# Patient Record
Sex: Female | Born: 1950 | Race: Black or African American | Hispanic: No | Smoking: Former smoker
Health system: Southern US, Community
[De-identification: ages and names within clinical notes are randomized; demographics above are authoritative.]

## PROBLEM LIST (undated history)

## (undated) DIAGNOSIS — G43909 Migraine, unspecified, not intractable, without status migrainosus: Secondary | ICD-10-CM

## (undated) DIAGNOSIS — E039 Hypothyroidism, unspecified: Secondary | ICD-10-CM

## (undated) DIAGNOSIS — M1990 Unspecified osteoarthritis, unspecified site: Secondary | ICD-10-CM

## (undated) DIAGNOSIS — I10 Essential (primary) hypertension: Secondary | ICD-10-CM

## (undated) DIAGNOSIS — E079 Disorder of thyroid, unspecified: Secondary | ICD-10-CM

## (undated) HISTORY — PX: HIP SURGERY: SHX245

## (undated) HISTORY — PX: HERNIA REPAIR: SHX51

## (undated) HISTORY — PX: JOINT REPLACEMENT: SHX530

## (undated) HISTORY — PX: ABDOMINAL HYSTERECTOMY: SHX81

---

## 2000-09-17 ENCOUNTER — Emergency Department (HOSPITAL_COMMUNITY): Admission: EM | Admit: 2000-09-17 | Discharge: 2000-09-18 | Payer: Self-pay | Admitting: *Deleted

## 2001-07-03 ENCOUNTER — Encounter: Payer: Self-pay | Admitting: Emergency Medicine

## 2001-07-03 ENCOUNTER — Emergency Department (HOSPITAL_COMMUNITY): Admission: EM | Admit: 2001-07-03 | Discharge: 2001-07-03 | Payer: Self-pay | Admitting: Emergency Medicine

## 2001-07-05 ENCOUNTER — Encounter: Payer: Self-pay | Admitting: Internal Medicine

## 2001-07-05 ENCOUNTER — Inpatient Hospital Stay (HOSPITAL_COMMUNITY): Admission: EM | Admit: 2001-07-05 | Discharge: 2001-07-08 | Payer: Self-pay | Admitting: *Deleted

## 2001-07-06 ENCOUNTER — Encounter: Payer: Self-pay | Admitting: *Deleted

## 2001-09-12 ENCOUNTER — Emergency Department (HOSPITAL_COMMUNITY): Admission: EM | Admit: 2001-09-12 | Discharge: 2001-09-12 | Payer: Self-pay | Admitting: *Deleted

## 2001-09-12 ENCOUNTER — Encounter: Payer: Self-pay | Admitting: *Deleted

## 2001-11-07 ENCOUNTER — Encounter (HOSPITAL_COMMUNITY): Admission: RE | Admit: 2001-11-07 | Discharge: 2001-12-07 | Payer: Self-pay | Admitting: Sports Medicine

## 2001-12-08 ENCOUNTER — Encounter (HOSPITAL_COMMUNITY): Admission: RE | Admit: 2001-12-08 | Discharge: 2002-01-07 | Payer: Self-pay | Admitting: Sports Medicine

## 2002-06-15 ENCOUNTER — Emergency Department (HOSPITAL_COMMUNITY): Admission: EM | Admit: 2002-06-15 | Discharge: 2002-06-16 | Payer: Self-pay | Admitting: Emergency Medicine

## 2002-06-15 ENCOUNTER — Encounter: Payer: Self-pay | Admitting: Emergency Medicine

## 2002-11-06 ENCOUNTER — Ambulatory Visit (HOSPITAL_COMMUNITY): Admission: RE | Admit: 2002-11-06 | Discharge: 2002-11-06 | Payer: Self-pay | Admitting: Family Medicine

## 2003-11-07 ENCOUNTER — Emergency Department (HOSPITAL_COMMUNITY): Admission: EM | Admit: 2003-11-07 | Discharge: 2003-11-07 | Payer: Self-pay | Admitting: Emergency Medicine

## 2004-05-10 ENCOUNTER — Ambulatory Visit (HOSPITAL_COMMUNITY): Admission: RE | Admit: 2004-05-10 | Discharge: 2004-05-10 | Payer: Self-pay | Admitting: Family Medicine

## 2004-05-13 ENCOUNTER — Emergency Department (HOSPITAL_COMMUNITY): Admission: EM | Admit: 2004-05-13 | Discharge: 2004-05-13 | Payer: Self-pay | Admitting: Emergency Medicine

## 2004-05-25 ENCOUNTER — Emergency Department (HOSPITAL_COMMUNITY): Admission: EM | Admit: 2004-05-25 | Discharge: 2004-05-25 | Payer: Self-pay | Admitting: Emergency Medicine

## 2004-06-13 ENCOUNTER — Ambulatory Visit (HOSPITAL_COMMUNITY): Admission: RE | Admit: 2004-06-13 | Discharge: 2004-06-13 | Payer: Self-pay | Admitting: Family Medicine

## 2004-11-25 ENCOUNTER — Emergency Department (HOSPITAL_COMMUNITY): Admission: EM | Admit: 2004-11-25 | Discharge: 2004-11-25 | Payer: Self-pay | Admitting: Emergency Medicine

## 2005-01-29 ENCOUNTER — Emergency Department (HOSPITAL_COMMUNITY): Admission: EM | Admit: 2005-01-29 | Discharge: 2005-01-29 | Payer: Self-pay | Admitting: Emergency Medicine

## 2005-02-26 ENCOUNTER — Emergency Department (HOSPITAL_COMMUNITY): Admission: EM | Admit: 2005-02-26 | Discharge: 2005-02-26 | Payer: Self-pay | Admitting: Emergency Medicine

## 2005-10-18 ENCOUNTER — Emergency Department (HOSPITAL_COMMUNITY): Admission: EM | Admit: 2005-10-18 | Discharge: 2005-10-18 | Payer: Self-pay | Admitting: Emergency Medicine

## 2005-12-10 ENCOUNTER — Emergency Department (HOSPITAL_COMMUNITY): Admission: EM | Admit: 2005-12-10 | Discharge: 2005-12-10 | Payer: Self-pay | Admitting: Emergency Medicine

## 2006-02-27 ENCOUNTER — Emergency Department (HOSPITAL_COMMUNITY): Admission: EM | Admit: 2006-02-27 | Discharge: 2006-02-27 | Payer: Self-pay | Admitting: Emergency Medicine

## 2006-04-13 ENCOUNTER — Emergency Department (HOSPITAL_COMMUNITY): Admission: EM | Admit: 2006-04-13 | Discharge: 2006-04-14 | Payer: Self-pay | Admitting: Emergency Medicine

## 2006-10-20 ENCOUNTER — Emergency Department (HOSPITAL_COMMUNITY): Admission: EM | Admit: 2006-10-20 | Discharge: 2006-10-20 | Payer: Self-pay | Admitting: Emergency Medicine

## 2006-10-21 ENCOUNTER — Observation Stay (HOSPITAL_COMMUNITY): Admission: EM | Admit: 2006-10-21 | Discharge: 2006-10-24 | Payer: Self-pay | Admitting: Emergency Medicine

## 2008-01-20 ENCOUNTER — Emergency Department (HOSPITAL_COMMUNITY): Admission: EM | Admit: 2008-01-20 | Discharge: 2008-01-20 | Payer: Self-pay | Admitting: Emergency Medicine

## 2009-02-21 ENCOUNTER — Emergency Department (HOSPITAL_COMMUNITY): Admission: EM | Admit: 2009-02-21 | Discharge: 2009-02-21 | Payer: Self-pay | Admitting: Emergency Medicine

## 2009-02-27 ENCOUNTER — Emergency Department (HOSPITAL_COMMUNITY): Admission: EM | Admit: 2009-02-27 | Discharge: 2009-02-27 | Payer: Self-pay | Admitting: Emergency Medicine

## 2009-05-24 ENCOUNTER — Emergency Department (HOSPITAL_COMMUNITY): Admission: EM | Admit: 2009-05-24 | Discharge: 2009-05-24 | Payer: Self-pay | Admitting: Emergency Medicine

## 2009-06-11 ENCOUNTER — Emergency Department (HOSPITAL_COMMUNITY): Admission: EM | Admit: 2009-06-11 | Discharge: 2009-06-11 | Payer: Self-pay | Admitting: Emergency Medicine

## 2009-06-13 ENCOUNTER — Ambulatory Visit (HOSPITAL_COMMUNITY): Admission: RE | Admit: 2009-06-13 | Discharge: 2009-06-13 | Payer: Self-pay | Admitting: Emergency Medicine

## 2010-05-31 LAB — CBC
HCT: 41.7 % (ref 36.0–46.0)
Hemoglobin: 14.7 g/dL (ref 12.0–15.0)
MCHC: 35.2 g/dL (ref 30.0–36.0)
MCV: 96.7 fL (ref 78.0–100.0)
Platelets: 211 10*3/uL (ref 150–400)
RBC: 4.31 MIL/uL (ref 3.87–5.11)
RDW: 14.8 % (ref 11.5–15.5)
WBC: 11.1 10*3/uL — ABNORMAL HIGH (ref 4.0–10.5)

## 2010-05-31 LAB — BASIC METABOLIC PANEL
BUN: 11 mg/dL (ref 6–23)
CO2: 29 mEq/L (ref 19–32)
Calcium: 9.6 mg/dL (ref 8.4–10.5)
Chloride: 105 mEq/L (ref 96–112)
Creatinine, Ser: 0.84 mg/dL (ref 0.4–1.2)
GFR calc Af Amer: >60 mL/min (ref >60)
GFR calc non Af Amer: >60 mL/min (ref >60)
Glucose, Bld: 96 mg/dL (ref 70–99)
Potassium: 4 mEq/L (ref 3.5–5.1)
Sodium: 141 mEq/L (ref 135–145)

## 2010-05-31 LAB — DIFFERENTIAL
Basophils Absolute: 0.1 10*3/uL (ref 0.0–0.1)
Basophils Relative: 1 % (ref 0–1)
Eosinophils Absolute: 0.1 10*3/uL (ref 0.0–0.7)
Eosinophils Relative: 1 % (ref 0–5)
Lymphocytes Relative: 35 % (ref 12–46)
Lymphs Abs: 3.9 10*3/uL (ref 0.7–4.0)
Monocytes Absolute: 1.1 10*3/uL — ABNORMAL HIGH (ref 0.1–1.0)
Monocytes Relative: 10 % (ref 3–12)
Neutro Abs: 5.9 10*3/uL (ref 1.7–7.7)
Neutrophils Relative %: 53 % (ref 43–77)

## 2010-06-02 LAB — SEDIMENTATION RATE: Sed Rate: 16 mm/hr (ref 0–22)

## 2010-07-21 ENCOUNTER — Emergency Department (HOSPITAL_COMMUNITY)
Admission: EM | Admit: 2010-07-21 | Discharge: 2010-07-21 | Disposition: A | Discharge status: Home / Self Care | Payer: Self-pay | Attending: Emergency Medicine | Admitting: Emergency Medicine

## 2010-07-21 DIAGNOSIS — K5900 Constipation, unspecified: Secondary | ICD-10-CM | POA: Insufficient documentation

## 2010-07-25 NOTE — Consult Note (Signed)
Rhonda [name], Rhonda [name]               ACCOUNT NO.:  [id number]   MEDICAL RECORD NO.:  [id number]          PATIENT TYPE:  OBV   LOCATION:  A309                          FACILITY:  [facility]   PHYSICIAN:  Kofi A. Gerilyn Pilgrim, M.D. DATE OF BIRTH:  02/12/1950   DATE OF CONSULTATION:  DATE OF DISCHARGE:                                 CONSULTATION   This is a 60 year old black female who was in her state of usual normal  health when she developed headache on awakening one morning.  The  headache was in the frontal region in the left periorbital area.  She  graded it as 4/10 pain initially.  As the day progressed, however, the  headache became worse and over several hours progressed to a 10/10  headache.  She did have some vertigo described as a spinning sensation  with this.  She has had vertigo in the past.  The vertigo has been a  longstanding problem again associated with a spinning sensation.  This  however was different as she had this progressive and severe headache.  She also had intense nausea and vomiting, which resulted in making the  patient come to the emergency room.  She does not have a prior history  of headaches.   PAST MEDICAL HISTORY:  Significant for episodic vertigo and bipolar  disorder.   MEDICATIONS:  None.   FAMILY HISTORY:  Unremarkable.   SURGICAL HISTORY:  Hysterectomy.   SOCIAL HISTORY:  She is a non-smoker.  No illicit drug use.  Does  consume alcohol socially.  She is married and lives with her spouse.   ALLERGIES:  NONE KNOWN.   REVIEW OF SYSTEMS:  Essentially unremarkable other than stated in the  history of present illness and unchanged from Dr. Claudean Severance on October 21, 2006.   PHYSICAL EXAMINATION:  GENERAL:  She is a pleasant mildly overweight  lady in no acute distress.  VITAL SIGNS:  Temperature 98.8, pulse 73, respirations 20, blood  pressure 136/78.  HEENT:  The head is supple.  Head is normocephalic, atraumatic.  ABDOMEN:  Soft.  EXTREMITIES:  No  significant edema.  MENTATION:  The patient is awake and alert.  She converses well.  Speech, language and cognition are intact.  CRANIAL NERVES:  Cranial nerve evaluation shows the pupils are equally  round and reactive to light and accommodation.  Extraocular movements  are intact.  Visual field full.  Facial motor strength is symmetric.  Tongue is midline.  Uvula is midline.  Tympanic membranes are fine,  although there is slight erythema on the left.  She did complain of  having a left ear ache a few days ago.  Motor examination shows normal  tongue bulk and strength.  There is no pronator drift.  Coordination is  intact.  There is no pass point or dysmetria.  Reflexes are symmetric  with downgoing plantar reflexes.  Sensation is normal to temperature and  light touch.   RADIOLOGIC DATA:  CT scan is negative.  MRI was also done and shows no  acute findings, essentially negative scan.   LABORATORY DATA:  Spinal fluid analysis was obtained, and cultures are  pending.  So far, they have been negative.  Protein slightly high at 52,  glucose 59, WBC R, RBCs 0.   ASSESSMENT:  New onset severe headache.  The character is consistent  with a severe migraine headache with an unusual presentation given her  age.  So far, most secondary causes of headache have been ruled out.  I  am still concerned about possible vascular malformation.   RECOMMENDATIONS:  I am going to suggest that MRA be obtained.  We should  continue with symptomatic treatment.  We may use opioids until her  workup is complete and symptomatic etiology ruled out.  Thank you for  this consultation.      Kofi A. Gerilyn Pilgrim, M.D.  Electronically Signed     KAD/MEDQ  D:  10/23/2006  T:  10/24/2006  Job:  [id number]

## 2010-07-25 NOTE — H&P (Signed)
Rhonda Rhonda [name], Rhonda [name] NO.:  [id number]   MEDICAL RECORD NO.:  [id number]          PATIENT TYPE:  OBV   LOCATION:  A309                          FACILITY:  [facility]   PHYSICIAN:  Dorris Singh, DO    DATE OF BIRTH:  02/12/1950   DATE OF ADMISSION:  10/21/2006  DATE OF DISCHARGE:  LH                              HISTORY & PHYSICAL   CHIEF COMPLAINT:  Headache.   HISTORY OF PRESENT ILLNESS:  The patient is a 60 year old African  American female who presented to the [facility] emergency room today  with a 1-day complaint of severe headache located in the frontal region.  The patient stated that headache got progressively worse with movement  as well as was followed by nausea and vomiting that would not stop.  She  was then seen in the emergency room, in which a lumbar puncture was done  and also a CT was done, and those were both negative.  The patient  stated that prior to this she has had bouts of vertigo and decided to  take some Antivert, which she got little relief from but still was  experiencing dizziness upon arrival to the emergency room.   PAST MEDICAL HISTORY:  Positive for bipolar disorder.  Currently she is  not being treated on any medication.   FAMILY HISTORY:  She says noncontributory.   SURGICAL HISTORY:  Positive for a hysterectomy.   SOCIAL HISTORY:  She is a nonsmoker, social drinker.  She currently  lives with her spouse and does not use any drugs.   She states that she has no known drug allergies and currently she was  taking Depakote a month ago, 2000 mg, but stopped that on her own and  she just took one tablet of Antivert that was old, dose unknown, today,  as far as medications are concerned.   REVIEW OF SYSTEMS:  GENERAL:  The patient denies any weight changes,  fatigue, but positive for a 1-day history of weakness.  There are no  skin changes, hair or nail changes or rashes.  HEAD:  Positive headache  located in the frontal region  with positive nausea, positive vomiting,  and positive visual changes.  EYES:  The patient is positive for  blurriness.  MOUTH, THROAT AND NECK:  Denies any bleeding gums,  hoarseness or sore throat.  CARDIAC:  Denies any history of  hypertension, murmurs and angina.  RESPIRATORY:  Denies any shortness of  breath, wheezing, coughing.  GI:  Denies any appetite changes, diarrhea,  constipation or bleeding.  URINARY:  Denies any frequency, hesitancy, or  urgency.  NEUROLOGIC:  Denies any loss of sensation, numbness or  tingling or paralysis, blackouts or seizures.  PSYCHIATRIC:  Positive  for bipolar disorder.  MUSCULOSKELETAL:  Negative muscular weakness,  joint stiffness, no changes in range of motion or swelling.   PHYSICAL EXAMINATION:  GENERAL:  This is a 60 year old [place] American  female who is well-developed, well-nourished, in no acute distress.  She  is pleasant and her affect is appropriate.  VITALS:  Temperature is 98, pulse is 69,  respirations 20, blood pressure  is 136/72.  SKIN:  There are no skin scars, rashes, bruises or tattoos.  Hair is  normal consistency.  Head is normocephalic, atraumatic and the shape is symmetrical.  Pupils:  The patient refused pupil examination due to light.  Ears are normal  shape and size.  No discharge noted.  Nose:  No turbinate discharge or  inflammation.  Throat:  No erythema or exudate noted.  NECK:  No masses.  Full range of motion.  HEART:  Regular rate and rhythm.  No murmurs, rubs or gallops.  LUNGS:  Chest symmetrical with respirations, no crackles, wheezes or  rhonchi.  ABDOMEN:  Round, soft, nontender, nondistended.  No rebound tenderness,  masses or guarding noted.  MUSCULOSKELETAL:  Full range of motion.  No redness, swelling or  tenderness in any of the extremities.  NEUROLOGIC:  Cranial nerves II-XII grossly intact.   LABS:  The patient had a lumbar puncture that did not show any  abnormalities.  Neutrophils, leukocytes,  monocytes and eosinophils were  few to count and she had a sedimentation rate done, which was 7.  Her  CMP:  Sodium was 140, potassium 3.7, chloride 107, carbon dioxide 25,  glucose 108, BUN 9 and creatinine 0.75.  Her CBC was a white count of  9.6, hemoglobin 14, hematocrit 40.7 and platelet count of 250.  Her  prothrombin time 12.9 and her INR is 0.9.   ASSESSMENT AND PLAN:  1. Severe headache.  2. Intractable nausea and vomiting.  3. Bipolar disorder.   PLAN:  1. Will administer Imitrex to see if that resolves her Headache.  Will      continue the patient on Antivert and continue with pain medication.      Currently CT was negative that was done in the ER.  2. Intractable nausea and vomiting.  Will continue with antiemetics.      We will also make sure the patient has DVT prophylaxis and GI      prophylaxis.  3. Right now there is no need to address her bipolar disorder.      According to the patient it is stable, and according to her      interview and examination, affect was appropriate, so we will      continue to monitor that.  4. Plan is that if the patient continues to improve, we will plan on      discharging her in 1-2 days and follow up with neurology      outpatient.      Dorris Singh, DO  Electronically Signed     CB/MEDQ  D:  10/21/2006  T:  10/22/2006  Job:  [id number]

## 2010-07-25 NOTE — Group Therapy Note (Signed)
Rhonda [name], Rhonda [name] NO.:  [id number]   MEDICAL RECORD NO.:  [id number]          PATIENT TYPE:  OBV   LOCATION:  A309                          FACILITY:  [facility]   PHYSICIAN:  Skeet Latch, DO    DATE OF BIRTH:  02/12/1950   DATE OF PROCEDURE:  10/22/2006  DATE OF DISCHARGE:                                 PROGRESS NOTE   SUBJECTIVE:  The patient continues to have a headache today.  The  patient states her headache is worse when she is supine and walking, and  then improves when she is supine as flat as possible.  The patient  states the headache is located in the left frontal region and is an 8 to  10 out of 10 at times.  The patient states the IV pain medication does  help the pain, but it does wear off and the headache returns.  The  patient denies any blurry vision, double vision, nausea, vomiting,  __________ .  The patient also admits that bright lights do aggravate  her headache.  On my exam, the patient is comfortable and does not  appear in acute distress, but the lights were turned off and she was  lying supine.   OBJECTIVE:  VITAL SIGNS:  Today, temperature is 98.3, pulse 74,  respirations 20, blood pressure 126/77.  CARDIOVASCULAR:  Regular rate and rhythm, no gallops, rubs, or murmurs.  LUNGS:  Clear to auscultation bilaterally, no rales, rhonchi, or  wheezing.  ABDOMEN:  Soft, nontender, and nondistended, positive bowel sounds, no  rigidity or guarding.  EXTREMITIES:  No clubbing, cyanosis, or edema.  NEUROLOGIC:  The patient is alert and oriented x3 and moves all  extremities.  No tremors or seizure activity is noted.   ASSESSMENT AND PLAN:  Persistent headache.  Will get a magnetic  resonance imaging of her brain in the a.m. to rule out any  abnormalities, hemorrhage, or anything of that nature.  Will also get a  Neurology consultation regarding onset of headache after age 60.  Will  also add Fioricet q.4h in case this is a tension migraine.   The patient  continues to be on intravenous pain medication.  The patient did get a  dose of Imitrex last evening.  Will await neurologic recommendations at  this time and follow the patient closely.      Skeet Latch, DO  Electronically Signed     SM/MEDQ  D:  10/22/2006  T:  10/23/2006  Job:  [id number]

## 2010-07-25 NOTE — Group Therapy Note (Signed)
Rhonda [name], CASSELMAN NO.:  [id number]   MEDICAL RECORD NO.:  [id number]          PATIENT TYPE:  OBV   LOCATION:  A309                          FACILITY:  [facility]   PHYSICIAN:  Skeet Latch, DO    DATE OF BIRTH:  02/12/1950   DATE OF PROCEDURE:  DATE OF DISCHARGE:                                 PROGRESS NOTE   SUBJECTIVE:  The patient states that her headaches have slightly  improved today.  The patient states she is trying to be normal, get out  of bed and do more things today.  The patient denies any chest pain,  abdominal pain, or urinary complaints.  However, the patient yesterday  did have problems urinating and a Foley catheter was placed and is still  in place.  The patient is having good urinary output at this time.   OBJECTIVE:  VITAL SIGNS:  Temperature is 98.8, pulse 80, respirations  20, blood pressure 155/79.  CARDIOVASCULAR:  Regular rate and rhythm.  No murmurs, gallops or rubs.  LUNGS:  Clear to auscultation bilaterally.  No rales, rhonchi or  wheezing.  ABDOMEN:  Soft, nontender, nondistended, positive bowel sounds.  EXTREMITIES:  No clubbing, cyanosis or edema.  NEUROLOGIC:  Cranial nerves II-XII are grossly intact.  The patient is  still sensitive to light at this time but her headache seems to be  improved.   ASSESSMENT/PLAN:  1. New onset of severe headaches, left periorbital.  The headaches      seem to have improved.  The patient continues to be on intravenous      pain medications with Dilaudid.  Fioricet was added yesterday.      This seems to be improving her headache.  The patient does have an      MRA (magnetic resonance angiography) ordered per neurology to rule      out any malformations.  The patient did have an MRI (magnetic      resonance imaging) of her brain done without contrast last evening      which was negative.  2. Bipolar disorder seems to be stable at this time.  The patient is      not on any medications for this  at this time.   Anticipate the patient being discharged in the next 1 to 2 days if her  status continues to improve.      Skeet Latch, DO  Electronically Signed     SM/MEDQ  D:  10/23/2006  T:  10/24/2006  Job:  [id number]

## 2010-07-25 NOTE — Discharge Summary (Signed)
Rhonda [name], OCCHIPINTI NO.:  [id number]   MEDICAL RECORD NO.:  [id number]          PATIENT TYPE:  OBV   LOCATION:  A309                          FACILITY:  [facility]   PHYSICIAN:  Skeet Latch, DO    DATE OF BIRTH:  02/12/1950   DATE OF ADMISSION:  10/21/2006  DATE OF DISCHARGE:  08/14/2008LH                               DISCHARGE SUMMARY   DISCHARGE DIAGNOSES:  1. Late onset migraines.  2. 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  The [facility]      [facility]   BRIEF HOSPITAL COURSE:  This is a 60 year old African American female  who presented to [facility] emergency room with a 1-day complaint of  severe headache in the frontal region.  The patient states that her  headache got progressively worse with movement as well as having nausea  and vomiting.  The patient presented to the emergency room.  Lumbar  puncture was performed after a negative CT and both were negative.  The  patient still had bouts of vertigo and was given Antivert with little  relief.  The patient experienced dizziness and a constant headache.  The  patient had an MRA of her head performed which showed no evidence of  aneurysm, occlusion, stenosis or dissections.  The patient also had MRI  of her brain which also was negative.  On October 20, 2006, the patient  had a CT scan done of her head which was also negative.  The patient's  headache persisted and Neurology was consulted and believe these are  late onset migraines and believes a trial of oral medications can be  done at this time.  The patient's headache has slightly improved.  The  patient is well enough that be sent home.   Of note, the patient did have a [facility] Spotted Fever titer  performed by the emergency room and final results show an IgG of 1.21  which is positive.  Her IgM was 0.75 which was negative.  She also had  an RPR which was nonreactive.  Sed rate was also in normal range.  CSF  cultures were negative as well as her blood cultures.   At this point, the patient is stable and ready for discharge.   Vital signs on discharge:  Temperature is 98.1, pulse 82, respirations  20, blood pressure 117/71.   LABORATORY DATA:  Look above for labs.   PHYSICAL EXAMINATION:  HEENT: Head is atraumatic, normocephalic.  Eyes  were PERRL, EOMI.  NECK:  Soft, supple, nontender, nondistended.  HEART: Regular rate and rhythm.  No rubs, gallops or murmurs.  LUNGS:  Clear to auscultation bilaterally.  No rales, rhonchi or  wheezing.  ABDOMEN:  Soft, nontender, nondistended.  No rigidity or guarding.  EXTREMITIES: No clubbing, cyanosis or edema.  NEUROLOGIC: Cranial nerves II-XII were grossly intact.  The patient is  moving all extremities.  No meningeal signs were appreciated.  The  patient is ambulating at this time.   DISCHARGE MEDICATIONS:  1. Doxycycline 100 mg one tablet p.o. b.i.d. for 7 days.  2. Ambien 10 mg one p.o. q.h.s. p.r.n.  3. Imitrex 50 mg one tablet at the onset of headache; if no relief,      she can take another after 2 hours, but no more than 200 mg in a      day.   CONDITION ON DISCHARGE:  Stable.   DISPOSITION:  The patient will be discharged to home.   DISCHARGE DIET:  Patient to maintain a regular diet.   ACTIVITY:  Increase her activity slowly.   INSTRUCTIONS:  The patient is to follow up with her primary care  physician in the next week.  Also, the patient is to follow up with  Neurology, Dr. Gerilyn Pilgrim, in the next 2-3 weeks regarding her headaches.  The patient is to also return to the emergency room if her headaches  continue to be severe or contact her primary care physician or  Neurology.      Skeet Latch, DO  Electronically Signed     SM/MEDQ  D:  10/24/2006  T:  10/25/2006  Job:  [id number]   cc:   Darleen Crocker A. Gerilyn Pilgrim, M.D.  Fax: [phone]

## 2010-07-28 NOTE — Discharge Summary (Signed)
[place]  Patient:    Rhonda [name], Rhonda [name] Visit Number: [id number] MRN: [id number]          Service Type: MED Location: [id number] Attending Physician:  Enos Fling Dictated by:   Candy Sledge, M.D. Admit Date:  07/05/2001 Discharge Date: 07/08/2001                             Discharge Summary  HISTORY OF PRESENT ILLNESS:  Rhonda [name] is a 60 year old, right-handed, married female from [place], [place].  Ten days prior to admission, she experienced the acute onset of "acute dizziness," nausea and headache while going fishing.  She had some right ear discomfort at the onset of the symptoms.  Headaches have persisted and have been primarily frontal in location, sometimes posterior.  Headaches initially interfered with her work. Finally on April 24, she presented to the [facility] ER and had a CT scan of the head which was negative and lumbar puncture which was negative as well. Protein was minimally elevated at 46.  She has been treated symptomatically with no relief.  Headache has not changed following lumbar puncture, but is generally worse when she is up and complains of increased pressure in her head and "wheeziness."  She has had periods of sweating, but no documented fevers. Her neck is a bit sore, but not markedly stiff.  She has had some photophobia.  There has been no history of other recent illnesses.  She has a tick bite several months ago.  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  PAST MEDICAL HISTORY:  Positive for a bipolar disorder.  She stopped Depakote a few weeks ago.  MEDICATIONS:  None.  ALLERGIES:  No known drug allergies.  PHYSICAL EXAMINATION:  GENERAL:  The patient appeared uncomfortable, but was able to provide an excellent history.  VITAL SIGNS:  Blood pressure 122/64, pulse 72, respiratory rate  20, temperature 98.2.  HEENT:  Normocephalic, atraumatic.  NECK:  Sore to palpation in the cervical paraspinal region.  There is no meningismus.  NEUROLOGIC:  Nonfocal.  Her fundi were poorly visualized.  ASSESSMENT/PLAN:  The patient was admitted with the impression of new-onset prolonged headache, question etiology, doubt migraine.  Headaches were felt to be atypical for tension.  There is no obvious evidence of infection.  We will attempt to rule out other forms of symptomatic headache.  Plan was to obtain an MRI of the brain with MR angiography and offer symptomatic treatment.  LABORATORY DATA AND X-RAY FINDINGS:  Labs have been obtained at [facility] prior to transfer, but did not accompany the patient.  These were reviewed, however, and were essentially unremarkable.  She did have urinalysis on April 27, which did show 11-20 wbcs and 0-2 rbcs.  There were a few bacteria.  Sedimentation rate was 5 mm per hour.  The Lime disease titers were negative.  MRI of the brain on July 06, 2001, showed mild nonspecific white matter type changes.  There was a right petrous apex area of altered signal intensity of questionable significance, but no evidence of acute infarct or abnormal intracranial enhancing lesion.  MRA showed no large vessel occlusion or aneurysm.  HOSPITAL COURSE:  The patient was admitted to the neurosinuses unit.  She was offered symptomatic treatment for her headache.  The headache was improved with  Demerol.  On the day following admission, her headache was still present, but somewhat better.  She had received a trial of DHE and Reglan as well as Decadron which did not provide significant relief arguing against diagnosis of migraine.  The patient continued to have some question of whether an ear infection or sinus infection could be contributing to her headaches.  The only minimal findings were noted on the MRI relating to this.  It was felt that she may  have urinary tract infection.  She was started on amoxicillin initially for the question of sinusitis, but after reviewing her urinalysis showing pyuria, it was decided to switch her to Septra DS one p.o. b.i.d.  On April 29, she is doing better, but still had some headache and mild dizziness.  Test results were reviewed showing no evidence of significant intracranial pathology or other neurologic condition to explain her headaches.  It was felt that she had arrived at maximal hospital benefit and was discharged to home.  DISCHARGE DIAGNOSES: 1. Prolonged headache, questioned secondary to sinusitis or otitis media. 2. History of bipolar disorder.  DISCHARGE MEDICATIONS: 1. Septra DS one p.o. b.i.d. x10 days. 2. Phenergan 25 mg one every 4-6h. for nausea and dizziness. 3. Restoril 15 mg at bedtime p.r.n. sleep.  DISPOSITION:  Discharged to home with instructions to follow up with Dr. Noreene Filbert as needed.  She will remain out of work until May 12.  CONDITION ON DISCHARGE:  Stable to improved.  Prognosis good. Dictated by:   Candy Sledge, M.D. Attending Physician:  Enos Fling DD:  07/18/01 TD:  07/21/01 Job: [id number]

## 2010-11-30 DIAGNOSIS — G8918 Other acute postprocedural pain: Secondary | ICD-10-CM | POA: Insufficient documentation

## 2010-11-30 DIAGNOSIS — Z96643 Presence of artificial hip joint, bilateral: Secondary | ICD-10-CM | POA: Insufficient documentation

## 2010-11-30 DIAGNOSIS — Z531 Procedure and treatment not carried out because of patient's decision for reasons of belief and group pressure: Secondary | ICD-10-CM | POA: Insufficient documentation

## 2010-12-05 ENCOUNTER — Emergency Department (HOSPITAL_COMMUNITY)
Admission: EM | Admit: 2010-12-05 | Discharge: 2010-12-05 | Disposition: A | Discharge status: Home / Self Care | Payer: Medicaid Other | Attending: Emergency Medicine | Admitting: Emergency Medicine

## 2010-12-05 ENCOUNTER — Encounter: Payer: Self-pay | Admitting: *Deleted

## 2010-12-05 DIAGNOSIS — Z96649 Presence of unspecified artificial hip joint: Secondary | ICD-10-CM | POA: Insufficient documentation

## 2010-12-05 DIAGNOSIS — Z87898 Personal history of other specified conditions: Secondary | ICD-10-CM

## 2010-12-05 DIAGNOSIS — Z471 Aftercare following joint replacement surgery: Secondary | ICD-10-CM | POA: Insufficient documentation

## 2010-12-05 NOTE — ED Notes (Signed)
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

## 2010-12-05 NOTE — ED Provider Notes (Addendum)
History     CSN: [id number] Arrival date & time: 12/05/2010  7:02 PM  Chief Complaint  Patient presents with  . Wound Infection    HPI  (Consider location/radiation/quality/duration/timing/severity/associated sxs/prior treatment)  Patient is a 60 y.o. female presenting with wound check. The history is provided by the patient.  Wound Check  Treated in ED: She is 5 days out from a left total hip replacement.   Prior ED Treatment: She had a large amount of sudden drainage from the incision site 1 hour before arrival here.   Her temperature was unmeasured prior to arrival. Wound drainage status: Drainage is yellow in color. There is no redness present. There is no swelling present. Pain course: No increased pain at the incision site. She has no difficulty moving the affected extremity or digit.    History reviewed. No pertinent past medical history.  Past Surgical History  Procedure Date  . Joint replacement   . Hip surgery   . Abdominal hysterectomy     No family history on file.  History  Substance Use Topics  . Smoking status: Never Smoker   . Smokeless tobacco: Not on file  . Alcohol Use: Yes    OB History    Grav Para Term Preterm Abortions TAB SAB Ect Mult Living                  Review of Systems  Review of Systems  Constitutional: Negative for fever.  HENT: Negative for congestion, sore throat and neck pain.   Eyes: Negative.   Respiratory: Negative for chest tightness and shortness of breath.   Cardiovascular: Negative for chest pain.  Gastrointestinal: Negative for nausea and abdominal pain.  Genitourinary: Negative.   Musculoskeletal: Positive for arthralgias. Negative for joint swelling.  Skin: Negative.  Negative for rash and wound.  Neurological: Negative for dizziness, weakness, light-headedness, numbness and headaches.  Hematological: Negative.   Psychiatric/Behavioral: Negative.     Allergies  Review of patient's allergies indicates no known  allergies.  Home Medications   Aspirin 325 mg po qd Oxycodone 5/325 trazadone 50 mg po qhs    Physical Exam    BP 143/70  Pulse 87  Temp(Src) 98.7 F (37.1 C) (Oral)  Resp 20  Ht 5\' 4"  (1.626 m)  Wt 155 lb (70.308 kg)  BMI 26.61 kg/m2  SpO2 100%  Physical Exam  Nursing note and vitals reviewed. Constitutional: She is oriented to person, place, and time. She appears well-developed and well-nourished.  HENT:  Head: Normocephalic and atraumatic.  Eyes: Conjunctivae are normal.  Neck: Normal range of motion.  Cardiovascular: Normal rate, regular rhythm, normal heart sounds and intact distal pulses.   Pulmonary/Chest: Effort normal and breath sounds normal. She has no wheezes.  Abdominal: Soft. Bowel sounds are normal. There is no tenderness.  Musculoskeletal: Normal range of motion.  Neurological: She is alert and oriented to person, place, and time.  Skin: Skin is warm and dry. No erythema.       Well appearing incision with staples in place on left lateral hip through lateral buttock.  Scant serous drainage,  No purulent drainage,  No surrounding erythema ,  Induration or fluctuance.  Psychiatric: She has a normal mood and affect.    ED Course  Procedures (including critical care time)  Labs Reviewed - No data to display No results found.   1. No post-op complications      MDM Post op wound check with normal exam.  No evidence of  infection at this time.  Patient was seen by Dr. Estell Harpin prior to dc home.        Candis Musa, PA 12/05/10 2351              Pt here for review of incission to abd.  pe  Abd. Healing well.  Medical screening examination/treatment/procedure(s) were conducted as a shared visit with non-physician practitioner(s) and myself.  I personally evaluated the patient during the encounter   Benny Lennert, MD 12/15/10 1155

## 2010-12-06 NOTE — ED Notes (Signed)
Medical screening examination/treatment/procedure(s) were conducted as a shared visit with non-physician practitioner(s) and myself.  I personally evaluated the patient during the encounter   Benny Lennert, MD 12/06/10 1740

## 2010-12-06 NOTE — ED Provider Notes (Signed)
No att. providers found  Benny Lennert, MD 12/06/10 [phone]

## 2010-12-25 LAB — CSF CELL COUNT WITH DIFFERENTIAL
RBC Count, CSF: 0
Tube #: 3

## 2010-12-25 LAB — BASIC METABOLIC PANEL
CO2: 25
Calcium: 9.2
Creatinine, Ser: 0.67
GFR calc non Af Amer: >60
Glucose, Bld: 108 — ABNORMAL HIGH

## 2010-12-25 LAB — CULTURE, BLOOD (ROUTINE X 2)
Report Status: 8162008
Report Status: 8162008

## 2010-12-25 LAB — CSF CULTURE W GRAM STAIN
Culture: NO GROWTH
Gram Stain: NONE SEEN

## 2010-12-25 LAB — COMPREHENSIVE METABOLIC PANEL
ALT: 34
AST: 27
Albumin: 3.8
Alkaline Phosphatase: 79
BUN: 9
CO2: 25
Calcium: 9.1
Chloride: 107
Creatinine, Ser: 0.75
GFR calc Af Amer: >60
GFR calc non Af Amer: >60
Glucose, Bld: 108 — ABNORMAL HIGH
Potassium: 3.7
Sodium: 140
Total Bilirubin: 0.6
Total Protein: 7.2

## 2010-12-25 LAB — DIFFERENTIAL
Basophils Absolute: 0
Basophils Absolute: 0
Basophils Relative: 0
Basophils Relative: 0
Eosinophils Absolute: 0
Eosinophils Relative: 0
Lymphocytes Relative: 26
Monocytes Absolute: 0.7
Monocytes Relative: 7
Neutro Abs: 5.8
Neutro Abs: 7
Neutrophils Relative %: 65

## 2010-12-25 LAB — CBC
HCT: 40.7
Hemoglobin: 14
MCHC: 34.2
MCHC: 34.5
MCV: 94
Platelets: 237
Platelets: 250
RBC: 4.33
RDW: 13.2
RDW: 13.6
WBC: 9.6

## 2010-12-25 LAB — URINALYSIS, ROUTINE W REFLEX MICROSCOPIC
Bilirubin Urine: NEGATIVE
Glucose, UA: NEGATIVE
Ketones, ur: 15 — AB
Leukocytes, UA: NEGATIVE
Nitrite: NEGATIVE
Protein, ur: NEGATIVE
pH: 6

## 2010-12-25 LAB — URINE CULTURE: Special Requests: POSITIVE

## 2010-12-25 LAB — RPR: RPR Ser Ql: NONREACTIVE

## 2010-12-25 LAB — URINE MICROSCOPIC-ADD ON

## 2010-12-25 LAB — PROTIME-INR: INR: 0.9

## 2011-03-14 ENCOUNTER — Ambulatory Visit (HOSPITAL_COMMUNITY)
Admission: RE | Admit: 2011-03-14 | Discharge: 2011-03-14 | Disposition: A | Discharge status: Home / Self Care | Payer: Medicaid Other | Source: Ambulatory Visit | Attending: Student | Admitting: Student

## 2011-03-14 DIAGNOSIS — R29898 Other symptoms and signs involving the musculoskeletal system: Secondary | ICD-10-CM | POA: Insufficient documentation

## 2011-03-14 DIAGNOSIS — M25659 Stiffness of unspecified hip, not elsewhere classified: Secondary | ICD-10-CM | POA: Insufficient documentation

## 2011-03-14 DIAGNOSIS — IMO0001 Reserved for inherently not codable concepts without codable children: Secondary | ICD-10-CM | POA: Insufficient documentation

## 2011-03-14 DIAGNOSIS — M25559 Pain in unspecified hip: Secondary | ICD-10-CM | POA: Insufficient documentation

## 2011-03-14 DIAGNOSIS — M6281 Muscle weakness (generalized): Secondary | ICD-10-CM | POA: Insufficient documentation

## 2011-03-14 DIAGNOSIS — R262 Difficulty in walking, not elsewhere classified: Secondary | ICD-10-CM | POA: Insufficient documentation

## 2011-03-14 DIAGNOSIS — R269 Unspecified abnormalities of gait and mobility: Secondary | ICD-10-CM | POA: Insufficient documentation

## 2011-03-14 NOTE — Progress Notes (Signed)
Physical Therapy Evaluation  Patient Details  Name: Rhonda [name] MRN: [id number] Date of Birth: 1950-06-28  Today's Date: 03/14/2011 Time: 1005-1045 Time Calculation (min): 40 min  Visit#: 1  of 11   Re-eval: 04/13/11 Assessment Diagnosis: L THR Surgical Date: 11/30/10 Next MD Visit: 05/2011 Prior Therapy: none  Past Medical History: No past medical history on file. Past Surgical History:  Past Surgical History  Procedure Date  . Joint replacement   . Hip surgery   . Abdominal hysterectomy     Subjective Symptoms/Limitations Symptoms: Pt states that she had a B THR .  In April of 2012 the patient had her right completed and did well.  In September 20th 2012 she had her L hip completed.  The patient was given some exercies at the hospital to complete at home but she has not had any therapy.  The patient is ambulating without an assistive device.  The patient states her main concern is the fact that her L hip feels very stiff.  If she walks too long she will have pain in her L hip.  She states that her right hip is doing great. How long can you sit comfortably?: The patient states after about 20 minutes she will have to adjust her body so her weight is not on the L side. How long can you stand comfortably?: The patient states that she is able to stand for ten minutes before she begins to feel the tension occur in her L hip. How long can you walk comfortably?: The patient states she is able to walk for ten minutes and then she will start feeling the mm in her L hip start to tighten up. Pain Assessment Currently in Pain?: Yes Pain Score:   5 Pain Location: Hip Pain Orientation: Left Pain Type: Chronic pain Pain Onset: More than a month ago Pain Relieving Factors: adjusting her weight. Multiple Pain Sites: No  Precautions/Restrictions  Precautions Precautions: Posterior Hip Restrictions Weight Bearing Restrictions: No  Prior Functioning  Prior Function Able to Take  Stairs?: Reciprically Driving: Yes Vocation: Unemployed Leisure: Hobbies-yes (Comment) Comments: field service as a witness  Cognition/Observation Cognition Overall Cognitive Status: Appears within functional limits for tasks assessed Observation/Other Assessments Observations: Pt demonstrates a trendelenburg gait.   Assessment RLE Strength Right Hip Flexion: 3+/5 Right Hip Extension: 3+/5 Right Hip ABduction: 3/5 Right Hip ADduction: 3/5 Right Knee Flexion: 3+/5 Right Knee Extension: 3+/5 Right Ankle Dorsiflexion: 4/5 LLE Strength Left Hip Flexion: 2/5 Left Hip Extension: 2-/5 Left Hip ABduction: 2/5 Left Hip ADduction: 3/5 Left Knee Flexion: 3/5 Left Knee Extension: 3/5 Left Ankle Dorsiflexion: 3+/5  Exercise/Treatments Mobility/Balance  Static Standing Balance Single Leg Stance - Right Leg: 15  Single Leg Stance - Left Leg: 10    Seated Long Arc Quad: Left;5 reps Supine Bridges: 5 reps Straight Leg Raises: Left;5 reps;Limitations Straight Leg Raises Limitations: knee bent Other Supine Knee Exercises: L hip abduction x5 Sidelying Hip ABduction: Right;10 reps Prone  Hamstring Curl: 5 reps    Physical Therapy Assessment and Plan PT Assessment and Plan Clinical Impression Statement: Pt with decreased strength, decreased balance and stiffness causing difficulty in walking. Pt will benefit from skilled Pt to return pt to previous functional level and improve her quality of life.  Begin pt on Gait trainer.  Sit to stand, heel raise, rocker board next treatment.      Goals Home Exercise Program Pt will Perform Home Exercise Program: Independently PT Short Term Goals Time to Complete Short  Term Goals: 2 weeks PT Short Term Goal 1: Pt to be able to sit for an hour  PT Short Term Goal 2: Pt to be able to stand for 20 minutes to make a simple meal PT Short Term Goal 3: Pt to be able to walk for 20 minutes to be able to do minimal grocery shopping PT Short Term  Goal 4: Pt to be able to SLS x 20 seconds to allow decrease risk of falls PT Long Term Goals Time to Complete Long Term Goals:  (6 weeks) PT Long Term Goal 1: Pt strength to be able to sit for two hours without having to adjust to be able to watch a movie PT Long Term Goal 2: Pt strength to be at least 4/5 to allow pt to be able to stand/walk for an hour to be able to allow pt to cook a full meal and go full grocery shopping Long Term Goal 3: Pt to have a normalized gati pattern.  Problem List Patient Active Problem List  Diagnoses  . Stiffness of joint, not elsewhere classified, pelvic region and thigh  . Abnormality of gait  . Weakness of both legs    PT - End of Session Activity Tolerance: Patient tolerated treatment well General Behavior During Session: [facility] for tasks performed Cognition: [facility] for tasks performed   RUSSELL,CINDY 03/14/2011, 11:03 AM  Physician Documentation Your signature is required to indicate approval of the treatment plan as stated above.  Please sign and either send electronically or make a copy of this report for your files and return this physician signed original.   Please mark one 1.__approve of plan  2. ___approve of plan with the following conditions.   ______________________________                                                          _____________________ Physician Signature                                                                                                             Date

## 2011-03-14 NOTE — Patient Instructions (Addendum)
HEP

## 2011-03-16 DIAGNOSIS — R0981 Nasal congestion: Secondary | ICD-10-CM | POA: Insufficient documentation

## 2011-03-19 ENCOUNTER — Ambulatory Visit (HOSPITAL_COMMUNITY)
Admission: RE | Admit: 2011-03-19 | Discharge: 2011-03-19 | Disposition: A | Discharge status: Home / Self Care | Payer: Medicaid Other | Source: Ambulatory Visit

## 2011-03-19 ENCOUNTER — Ambulatory Visit (HOSPITAL_COMMUNITY): Payer: Medicaid Other | Admitting: *Deleted

## 2011-03-19 NOTE — Progress Notes (Signed)
Physical Therapy Treatment Patient Details  Name: Rhonda [name] MRN: [id number] Date of Birth: 1950-09-24  Today's Date: 03/19/2011 Time: 7846-9629 Time Calculation (min): 42 min Visit#: 2  of 11   Re-eval: 04/13/11 Charges: Therex x 29' Gait x 10'  Subjective: Symptoms/Limitations Symptoms: Pt reports a very dull pain in her L hip. She reports it's more stiffness. Pain Assessment Currently in Pain?: Yes Pain Score:   5 Pain Location: Hip Pain Orientation: Left   Exercise/Treatments Standing Heel Raises: 10 reps Functional Squat: 10 reps;Limitations Functional Squat Limitations: mini squat Rocker Board: 2 minutes Gait Training: Gait trainging x 10' to increase heel-toe pattern, knee,flexion and hip flexion  Seated Long Arc Quad: 10 reps;Limitations;Left Long Arc Quad Limitations: 5" holds Other Seated Knee Exercises: Sit to stand w/o UE assist x 5 Supine Bridges: 10 reps Straight Leg Raises: 2 sets;5 reps Straight Leg Raises Limitations: knee bent Other Supine Knee Exercises: L hip abduction x10 Sidelying Hip ABduction: Right;10 reps Prone  Hamstring Curl: 10 reps   Physical Therapy Assessment and Plan PT Assessment and Plan Clinical Impression Statement: Pt refuses TM. Pt reports that it increases her vertigo sx. Gait training completed around department. Pt requires VC's to increase knee/hip flexion and to improve heel-toe pattern. Pt requires 3-4 standing rest breaks during gait trainging. Pt reports slight increase in soreness at end of session, which was tolerable (per pt). Pt completes all therex with good form and minimal need for cueing. PT Plan: Continue to progress per PT POC.    Problem List Patient Active Problem List  Diagnoses  . Stiffness of joint, not elsewhere classified, pelvic region and thigh  . Abnormality of gait  . Weakness of both legs    PT - End of Session Activity Tolerance: Patient tolerated treatment well General Behavior During  Session: [facility] for tasks performed Cognition: [facility] for tasks performed  Antonieta Iba 03/19/2011, 3:23 PM

## 2011-03-21 ENCOUNTER — Ambulatory Visit (HOSPITAL_COMMUNITY)
Admission: RE | Admit: 2011-03-21 | Discharge: 2011-03-21 | Disposition: A | Discharge status: Home / Self Care | Payer: Medicaid Other | Source: Ambulatory Visit | Attending: Student | Admitting: Student

## 2011-03-21 NOTE — Progress Notes (Signed)
Physical Therapy Treatment Patient Details  Name: YING BLANKENHORN MRN: [id number] Date of Birth: 11-12-1950  Today's Date: 03/21/2011 Time: 4098-1191 Time Calculation (min): 45 min Visit#: 3  of 11   Re-eval: 04/13/11 Charges: Therex x 24' Gait x 8' Manual x 10'  Subjective: Symptoms/Limitations Symptoms: Pt reports that her hip is a little stiff from sitting in the lobby. Pain Assessment Currently in Pain?: Yes Pain Score:   4 Pain Location: Hip Pain Orientation: Left   Exercise/Treatments Standing Heel Raises: 10 reps Functional Squat: 10 reps;Limitations Functional Squat Limitations: mini squat Rocker Board: 2 minutes SLS: L7" R:10" max of 3 Gait Training: Gait training x 8' to increase heel-toe pattern, knee,flexion and hip flexion  Other Standing Knee Exercises: L hip flexion x 10 Seated Long Arc Quad: 10 reps Long [employer] Limitations: w/3# wt 5" holds Other Seated Knee Exercises: Sit to stand w/o UE assist x 10 Supine Bridges: 10 reps Straight Leg Raises: 10 reps Straight Leg Raises Limitations: knee bent Sidelying Hip ABduction: Right;10 reps Prone  Hamstring Curl: 15 reps Hip Extension: 10 reps   Manual Therapy Manual Therapy: Other (comment) Other Manual Therapy: Manual techniques to decrease scar tissue over incision on L hip x 10'  Physical Therapy Assessment and Plan PT Assessment and Plan Clinical Impression Statement: Pt with improved gait this session. Pt able to self correct gait when knee flexion decreases. Pt coninues to display antalgic gait as muscles fatigue. Pt c/o tightness and hardness in L hip. Began manual techniques to decrease scar tissue on L hip after consulting Donnamae Jude, PT. Pt reports decreased tightness in L hip after manual but a slight increase in soreness to 5/10 . Pt describes soreness as tolerable.  PT Plan: Continue to progress per PT POC. Assess reaction to manual next session.     Problem List Patient Active Problem  List  Diagnoses  . Stiffness of joint, not elsewhere classified, pelvic region and thigh  . Abnormality of gait  . Weakness of both legs    PT - End of Session Activity Tolerance: Patient tolerated treatment well General Behavior During Session: [facility] for tasks performed Cognition: [facility] for tasks performed  Antonieta Iba 03/21/2011, 12:04 PM

## 2011-03-26 ENCOUNTER — Inpatient Hospital Stay (HOSPITAL_COMMUNITY): Admission: RE | Admit: 2011-03-26 | Payer: Medicaid Other | Source: Ambulatory Visit | Admitting: *Deleted

## 2011-03-28 ENCOUNTER — Ambulatory Visit (HOSPITAL_COMMUNITY): Payer: Medicaid Other | Admitting: *Deleted

## 2011-03-28 ENCOUNTER — Telehealth (HOSPITAL_COMMUNITY): Payer: Self-pay

## 2011-04-02 ENCOUNTER — Ambulatory Visit (HOSPITAL_COMMUNITY)
Admission: RE | Admit: 2011-04-02 | Discharge: 2011-04-02 | Disposition: A | Discharge status: Home / Self Care | Payer: Medicaid Other | Source: Ambulatory Visit | Attending: Student | Admitting: Student

## 2011-04-02 NOTE — Progress Notes (Signed)
Physical Therapy Treatment Patient Details  Name: Rhonda [name] MRN: [id number] Date of Birth: Mar 24, 1950  Today's Date: 04/02/2011 Time: 1191-4782 Time Calculation (min): 29 min Visit#: 4  of 11   Re-eval: 04/13/11 Charges:  Gait 8', therex 20'    Subjective: Symptoms/Limitations Symptoms: Pt. reports her L hip is sore/stiff but no real pain.  Attempted to add TM, however pt. refused secondary to her vertigo. Pain Assessment Currently in Pain?: No/denies   Exercise/Treatments Standing Heel Raises: 15 reps Functional Squat: 15 reps Rocker Board: 2 minutes SLS: L: 5sec, R: 10 sec Gait Training: Gait training x 8' to increase knee flexion and hip flexion  in hospital X 1000 ft Seated Long Arc Quad: 15 reps Long [employer] Limitations: w/3# wt 5" holds Other Seated Knee Exercises: Sit to stand w/o UE assist x 10 Sidelying Hip ABduction: Left;2 sets;10 reps;Limitations Hip ABduction Limitations: Began on wedge to work on end range stength Prone  Hamstring Curl: 15 reps Hip Extension: 10 reps      Physical Therapy Assessment and Plan PT Assessment and Plan Clinical Impression Statement: Pt. request for treatment to end early today as she was not feeling well.  Unable to complete supine therex.  Pt. required VC's to allow muscle to do the work (not to help with UE's) with sidelying hip abduction.  Pt. also refused to have scar tissue on L hip today stating it hurt more afterward.  Explained the importance of compliance with HEP/scar tissue reduction. PT Plan: Continue to progress therex.     Problem List Patient Active Problem List  Diagnoses  . Stiffness of joint, not elsewhere classified, pelvic region and thigh  . Abnormality of gait  . Weakness of both legs    PT - End of Session Activity Tolerance: Patient tolerated treatment well General Behavior During Session: [facility] for tasks performed Cognition: [facility] for tasks performed  [name] B. Bascom Levels, PTA 04/02/2011, 3:31  PM

## 2011-04-04 ENCOUNTER — Inpatient Hospital Stay (HOSPITAL_COMMUNITY): Admission: RE | Admit: 2011-04-04 | Payer: Medicaid Other | Source: Ambulatory Visit | Admitting: *Deleted

## 2011-04-09 ENCOUNTER — Ambulatory Visit (HOSPITAL_COMMUNITY): Payer: Medicaid Other | Admitting: Physical Therapy

## 2011-04-11 ENCOUNTER — Ambulatory Visit (HOSPITAL_COMMUNITY): Payer: Medicaid Other | Admitting: Physical Therapy

## 2011-04-19 ENCOUNTER — Ambulatory Visit (HOSPITAL_COMMUNITY): Payer: Medicaid Other | Admitting: Physical Therapy

## 2011-04-23 ENCOUNTER — Ambulatory Visit (HOSPITAL_COMMUNITY): Payer: Medicaid Other

## 2011-04-26 ENCOUNTER — Ambulatory Visit (HOSPITAL_COMMUNITY)
Admission: RE | Admit: 2011-04-26 | Discharge: 2011-04-26 | Disposition: A | Discharge status: Home / Self Care | Payer: Medicaid Other | Source: Ambulatory Visit | Attending: Orthopedic Surgery | Admitting: Orthopedic Surgery

## 2011-04-26 DIAGNOSIS — M25659 Stiffness of unspecified hip, not elsewhere classified: Secondary | ICD-10-CM

## 2011-04-26 DIAGNOSIS — R29898 Other symptoms and signs involving the musculoskeletal system: Secondary | ICD-10-CM

## 2011-04-26 DIAGNOSIS — R269 Unspecified abnormalities of gait and mobility: Secondary | ICD-10-CM | POA: Insufficient documentation

## 2011-04-26 DIAGNOSIS — M25569 Pain in unspecified knee: Secondary | ICD-10-CM | POA: Insufficient documentation

## 2011-04-26 DIAGNOSIS — IMO0001 Reserved for inherently not codable concepts without codable children: Secondary | ICD-10-CM | POA: Insufficient documentation

## 2011-04-26 DIAGNOSIS — M6281 Muscle weakness (generalized): Secondary | ICD-10-CM | POA: Insufficient documentation

## 2011-04-26 NOTE — Evaluation (Signed)
Physical Therapy re-evaluation  Patient Details  Name: Rhonda [name] MRN: [id number] Date of Birth: 14-Nov-1950  Today's Date: 04/26/2011 Time:0850  - 0920    Visit#: 1  of 1   Re-eval:  today Assessment Diagnosis: L THR Surgical Date: 11/30/10 Next MD Visit: 05/2011 Prior Therapy: none  Past Medical History: No past medical history on file. Past Surgical History:  Past Surgical History  Procedure Date  . Joint replacement   . Hip surgery   . Abdominal hysterectomy     Subjective Symptoms/Limitations Symptoms: Ms. Hunton is being referred for therapy after having a total hip operation on Sept. 20th.  The patient states that she had a THR on her L earlier in 2012 and they are not the same.  She states that she is experiencing tighttness in her hip and decreased ROM she states the tightness makes it difficult to get in a car and she has also noted a limp and she is still having trouble sleeping, she therefore wanted to come in and be assessed to see what else she might be able to do at home. How long can you sit comfortably?: The patient states that she is able to sit for a half hour but she is shifting her weight throughout that time period. How long can you stand comfortably?: The patient states that she is able to stand  for fifteen minutes but she is still putting the weight on her right side. How long can you walk comfortably?: The patient is able to walk for fifteen to twenty minutes and then it starts to tighten. Pain Assessment Currently in Pain?: Yes Pain Score:   4 Pain Location: Knee Pain Type: Chronic pain Pain Onset: More than a month ago Pain Frequency: Constant Pain Relieving Factors: adjusting weight.  Precautions/Restrictions  Precautions Precautions: Posterior Hip Restrictions Weight Bearing Restrictions: No  Prior Functioning  Prior Function Able to Take Stairs?: Reciprically Driving: Yes Vocation: Unemployed Leisure: Hobbies-yes  (Comment)  Cognition/Observation Cognition Overall Cognitive Status: Appears within functional limits for tasks assessed Observation/Other Assessments Observations: Pt demonstrates a trendelenburg gait.   Assessment RLE Strength Right Hip Flexion: 3+/5 Right Hip Extension: 3+/5 (4-/5 was 3+) Right Hip ABduction: 3+/5 Right Hip ADduction: 3+/5 (was 3/5.) Right Knee Flexion: 3+/5 Right Knee Extension: 5/5 Right Ankle Dorsiflexion: 4/5 LLE Strength Left Hip Flexion: 3+/5 Left Hip Extension:  (3/5 was 2-/5) Left Hip ABduction: 2/5 Left Hip ADduction: 3/5 Left Knee Flexion: 3/5 Left Knee Extension: 3/5 Left Ankle Dorsiflexion: 3+/5  Exercise/Treatments    Stretches Active Hamstring Stretch: 3 reps;30 seconds Quad Stretch: 3 reps;30 seconds  Supine Straight Leg Raises: Strengthening;Right;5 reps Other Supine Knee Exercises: L hip ab x 5 Sidelying Hip ABduction: Strengthening;Right;10 reps Prone  Hamstring Curl: 10 reps Hip Extension: Both;10 reps   Physical Therapy Assessment and Plan PT Assessment and Plan Clinical Impression Statement: Pt continuing to have difficulties following her THR.  She has already used her three medicaid visits and has came for a reassessment to see if there is any other exercises she can be doing to help her decrease her limp and improve her functional mobility.  Pt will benefit from skilled PT but has already had her three visits.  Pt reassessed and given a new HEP. Rehab Potential: Good Clinical Impairments Affecting Rehab Potential: stiffness, weakness, pain PT Plan: one treatment only as insurance will not cover any further visits.    Goals Home Exercise Program Pt will Perform Home Exercise Program: Independently  Problem List  Patient Active Problem List  Diagnoses  . Stiffness of joint, not elsewhere classified, pelvic region and thigh  . Abnormality of gait  . Weakness of both legs    PT - End of Session Activity Tolerance:  Patient tolerated treatment well General Behavior During Session: [facility] for tasks performed Cognition: [facility] for tasks performed   Walda Hertzog,CINDY 04/26/2011, 9:27 AM  Physician Documentation Your signature is required to indicate approval of the treatment plan as stated above.  Please sign and either send electronically or make a copy of this report for your files and return this physician signed original.   Please mark one 1.__approve of plan  2. ___approve of plan with the following conditions.   ______________________________                                                          _____________________ Physician Signature                                                                                                             Date

## 2011-04-26 NOTE — Patient Instructions (Addendum)
Pt given HEP 

## 2011-05-28 DIAGNOSIS — M7070 Other bursitis of hip, unspecified hip: Secondary | ICD-10-CM | POA: Insufficient documentation

## 2011-06-06 DIAGNOSIS — M7062 Trochanteric bursitis, left hip: Secondary | ICD-10-CM | POA: Insufficient documentation

## 2011-09-12 DIAGNOSIS — M25559 Pain in unspecified hip: Secondary | ICD-10-CM | POA: Insufficient documentation

## 2011-09-12 DIAGNOSIS — Z1212 Encounter for screening for malignant neoplasm of rectum: Secondary | ICD-10-CM | POA: Insufficient documentation

## 2011-09-12 DIAGNOSIS — J329 Chronic sinusitis, unspecified: Secondary | ICD-10-CM | POA: Insufficient documentation

## 2011-11-23 ENCOUNTER — Encounter (HOSPITAL_COMMUNITY): Payer: Self-pay | Admitting: *Deleted

## 2011-11-23 ENCOUNTER — Emergency Department (HOSPITAL_COMMUNITY): Payer: Medicaid Other

## 2011-11-23 ENCOUNTER — Emergency Department (HOSPITAL_COMMUNITY)
Admission: EM | Admit: 2011-11-23 | Discharge: 2011-11-23 | Disposition: A | Discharge status: Home / Self Care | Payer: Medicaid Other | Attending: Emergency Medicine | Admitting: Emergency Medicine

## 2011-11-23 DIAGNOSIS — M25559 Pain in unspecified hip: Secondary | ICD-10-CM

## 2011-11-23 DIAGNOSIS — Z7982 Long term (current) use of aspirin: Secondary | ICD-10-CM | POA: Insufficient documentation

## 2011-11-23 DIAGNOSIS — Z79899 Other long term (current) drug therapy: Secondary | ICD-10-CM | POA: Insufficient documentation

## 2011-11-23 DIAGNOSIS — Z96649 Presence of unspecified artificial hip joint: Secondary | ICD-10-CM | POA: Insufficient documentation

## 2011-11-23 MED ORDER — KETOROLAC TROMETHAMINE 30 MG/ML IJ SOLN
30.0000 mg | Freq: Once | INTRAMUSCULAR | Status: AC
  Administered 2011-11-23: 30 mg via INTRAMUSCULAR
  Filled 2011-11-23: qty 1

## 2011-11-23 MED ORDER — OXYCODONE-ACETAMINOPHEN 5-325 MG PO TABS
1.0000 | ORAL_TABLET | Freq: Once | ORAL | Status: AC
  Administered 2011-11-23: 1 via ORAL
  Filled 2011-11-23: qty 1

## 2011-11-23 MED ORDER — OXYCODONE-ACETAMINOPHEN 5-325 MG PO TABS: 1.0000 | ORAL_TABLET | ORAL | Status: AC | PRN

## 2011-11-23 NOTE — ED Notes (Addendum)
Rt hip pain, had replacement 1 year ago.  No injury to hip, with radiation down thigh.  Nasal congestion, sneezing, no cough.  Also has had diarrhea x5 since yesterday, No vomiting.  Headache

## 2011-11-23 NOTE — ED Provider Notes (Signed)
History     CSN: [id number]  Arrival date & time 11/23/11  1121   First MD Initiated Contact with Patient 11/23/11 1155      Chief Complaint  Patient presents with  . Hip Pain    HPI Rhonda [name] is a 61 y.o. female who presents to the ED with right hip pain. The pain started yesterday. Started as just an uncomfortable ache but this morning difficulty standing or walking and pain is severe. Pain radiates to right pelvic area and to right upper leg.  Has had bilateral hip replacements. This history was provided by the patient.  History reviewed. No pertinent past medical history.  Past Surgical History  Procedure Date  . Joint replacement   . Hip surgery   . Abdominal hysterectomy     History reviewed. No pertinent family history.  History  Substance Use Topics  . Smoking status: Never Smoker   . Smokeless tobacco: Not on file  . Alcohol Use: Yes    OB History    Grav Para Term Preterm Abortions TAB SAB Ect Mult Living                  Review of Systems  Constitutional: Positive for chills. Negative for fever.  HENT: Positive for congestion and sneezing. Negative for ear pain, facial swelling and neck pain.   Eyes: Negative for pain and itching.  Respiratory: Positive for cough. Negative for shortness of breath.   Cardiovascular: Negative for chest pain and palpitations.  Gastrointestinal: Positive for diarrhea. Negative for nausea, vomiting, abdominal pain and constipation.  Genitourinary: Negative for pelvic pain.  Musculoskeletal: Negative for back pain.       Positive for right hip pain and right upper leg pain.  Skin: Negative.   Neurological: Negative for dizziness, syncope and light-headedness.  Psychiatric/Behavioral: Negative for behavioral problems and confusion.    Allergies  Review of patient's allergies indicates no known allergies.  Home Medications   ASA 325 mg daily, Percocet 5-325 prn, trazodone 50 mg q hs  BP 115/81  Pulse 74  Temp  98.5 F (36.9 C) (Oral)  Resp 16  Ht 5' 4.5" (1.638 m)  Wt 150 lb (68.04 kg)  BMI 25.35 kg/m2  SpO2 100%  Physical Exam  Nursing note and vitals reviewed. Constitutional: She appears well-developed and well-nourished. No distress.       Uncomfortable appearing.  HENT:  Head: Normocephalic.  Neck: Neck supple.  Cardiovascular: Normal rate.   Pulmonary/Chest: Effort normal.  Abdominal: Soft. There is no tenderness.  Musculoskeletal:       Right hip tender with palpation. Right thigh tender on exam. Pedal pulse present and strong. Adequate circulation.  Psychiatric: She has a normal mood and affect. Her behavior is normal. Judgment and thought content normal.   Dg Hip Complete Right  11/23/2011  *RADIOLOGY REPORT*  Clinical Data: Right hip pain.  No known injury.  RIGHT HIP - COMPLETE 2+ VIEW  Comparison: 06/13/2004 right hip radiographs.  Findings: Interval bilateral total hip arthroplasty with screw fixed acetabular components.  There is no evidence of hardware loosening, acute fracture or dislocation.  The left femoral prosthesis is incompletely imaged.  Mild sacroiliac degenerative changes are present.  Mild sclerosis of the symphysis pubis has improved.  IMPRESSION: Interval bilateral total hip arthroplasty. No demonstrated complication or acute findings.   Original Report Authenticated By: Gerrianne Scale, M.D.    Ct Hip Right Wo Contrast  11/23/2011  *RADIOLOGY REPORT*  Clinical Data:  Right hip and thigh pain.  History of bilateral total hip arthroplasties.  CT OF THE RIGHT HIP WITHOUT CONTRAST  Technique:  Multidetector CT imaging was performed according to the standard protocol. Multiplanar CT image reconstructions were also generated.  Comparison: Right hip radiographs 11/23/2011.  Findings: The femoral and acetabular components are well seated. No complicating features are demonstrated to suggest loosening, particle disease or infection.  No obvious joint effusion.  The visualized  portion of the pelvis appears intact.  There are mild pubic symphysis degenerative changes.  No significant intrapelvic abnormalities are demonstrated.  No inguinal mass or hernia.  IMPRESSION: Well seated right hip prosthesis without CT findings for loosening, infection or particle disease.   Original Report Authenticated By: P. Loralie Champagne, M.D.    Assessment: 61 y.o. female with right hip pain and thigh pain  Plan:  Percocet Rx   Follow up with PCP, return as needed.  I have reviewed this patient's vital signs, nurses notes, appropriate labs and imaging. I have discussed the results with the patient and plan of care. Patient voices understanding. Patient ambulatory in the ED.    Medication List     As of 11/23/2011  3:45 PM    START taking these medications         oxyCODONE-acetaminophen 5-325 MG per tablet   Commonly known as: PERCOCET/ROXICET   Take 1 tablet by mouth every 4 (four) hours as needed for pain.      ASK your doctor about these medications         cholecalciferol 1000 UNITS tablet   Commonly known as: VITAMIN D      loratadine 10 MG tablet   Commonly known as: CLARITIN      meclizine 25 MG tablet   Commonly known as: ANTIVERT      traMADol 50 MG tablet   Commonly known as: ULTRAM      traZODone 50 MG tablet   Commonly known as: DESYREL          Where to get your medications    These are the prescriptions that you need to pick up.   You may get these medications from any pharmacy.         oxyCODONE-acetaminophen 5-325 MG per tablet           ED Course: Dr. Denton Lank in to evaluate the patient  Procedures   Janne Napoleon, NP 11/23/11 [address] [place], [place] 11/23/11 [phone]

## 2011-11-25 NOTE — ED Provider Notes (Signed)
Medical screening examination/treatment/procedure(s) were conducted as a shared visit with non-physician practitioner(s) and myself.  I personally evaluated the patient during the encounter Pt with right hip pain. No fever. No trauma. xr and ct neg for acute process. Good passive rom without pain. ls spine nt.   Suzi Roots, MD 11/25/11 [phone]

## 2011-12-13 DIAGNOSIS — H43829 Vitreomacular adhesion, unspecified eye: Secondary | ICD-10-CM | POA: Insufficient documentation

## 2012-12-04 ENCOUNTER — Encounter (HOSPITAL_COMMUNITY): Payer: Self-pay

## 2012-12-04 ENCOUNTER — Emergency Department (HOSPITAL_COMMUNITY)
Admission: EM | Admit: 2012-12-04 | Discharge: 2012-12-04 | Disposition: A | Discharge status: Home / Self Care | Payer: Medicare Other | Attending: Emergency Medicine | Admitting: Emergency Medicine

## 2012-12-04 DIAGNOSIS — Z79899 Other long term (current) drug therapy: Secondary | ICD-10-CM | POA: Insufficient documentation

## 2012-12-04 DIAGNOSIS — M542 Cervicalgia: Secondary | ICD-10-CM | POA: Insufficient documentation

## 2012-12-04 DIAGNOSIS — I889 Nonspecific lymphadenitis, unspecified: Secondary | ICD-10-CM | POA: Insufficient documentation

## 2012-12-04 DIAGNOSIS — L049 Acute lymphadenitis, unspecified: Secondary | ICD-10-CM

## 2012-12-04 MED ORDER — IBUPROFEN 600 MG PO TABS: 600.0000 mg | ORAL_TABLET | Freq: Four times a day (QID) | ORAL | Status: DC | PRN

## 2012-12-04 MED ORDER — CEPHALEXIN 500 MG PO CAPS: 500.0000 mg | ORAL_CAPSULE | Freq: Four times a day (QID) | ORAL | Status: DC

## 2012-12-04 NOTE — ED Notes (Signed)
Pt c/o pain in r jaw.  Pt says doesn't feel like a toothache.  Pt says hurts worse to open mouth or chew anything.  Pt says it started around 0830 this morning.

## 2012-12-04 NOTE — ED Notes (Signed)
Pain , tenderness to  Rt preauricular region . Hurts to move jaw or chew.

## 2012-12-04 NOTE — ED Provider Notes (Signed)
Medical screening examination/treatment/procedure(s) were performed by non-physician practitioner and as supervising physician I was immediately available for consultation/collaboration.    Vida Roller, MD 12/04/12 [phone]

## 2012-12-04 NOTE — ED Provider Notes (Signed)
CSN: [id number]     Arrival date & time 12/04/12  1537 History   First MD Initiated Contact with Patient 12/04/12 1548     Chief Complaint  Patient presents with  . Jaw Pain   (Consider location/radiation/quality/duration/timing/severity/associated sxs/prior Treatment) HPI Comments: ALYSSAMAE KLINCK is a 62 y.o. Female presenting with right jaw and lymph node pain which started today shortly after eating her breakfast.  She describes tenderness and a burning sensation with palpation and with trying to chew on that side.  She denies injury,  Denies fever, chills, sore throat,  Denies dental pain, gingival swelling or any history of recent dental problems. She also denies earache, ear drainage or decreased hearing acuity.  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  She had a tympanostomy tube placed by her ENT provider at [facility] over a year ago for a eustachian problem which also is improved.  She has taken no medicines for this complaint prior to arrival.     The history is provided by the patient.    History reviewed. No pertinent past medical history. Past Surgical History  Procedure Laterality Date  . Joint replacement    . Hip surgery    . Abdominal hysterectomy     No family history on file. History  Substance Use Topics  . Smoking status: Never Smoker   . Smokeless tobacco: Not on file  . Alcohol Use: Yes     Comment: occ   OB History   Grav Para Term Preterm Abortions TAB SAB Ect Mult Living                 Review of Systems  Constitutional: Negative for fever and chills.  HENT: Positive for neck pain. Negative for hearing loss, ear pain, congestion, sore throat, facial swelling, rhinorrhea, neck stiffness, dental problem, sinus pressure, tinnitus and ear discharge.   Respiratory: Negative for shortness of breath.   Gastrointestinal: Negative for nausea.  Neurological: Negative for dizziness and light-headedness.     Allergies  Review of patient's allergies indicates no known allergies.  Home Medications   Current Outpatient Rx  Name  Route  Sig  Dispense  Refill  . lidocaine (LIDODERM) 5 %   Transdermal   Place 1 patch onto the skin daily. Place 1 patch onto the skin daily. Remove & Discard patch within 12 hours or as directed by MD         . loratadine (CLARITIN) 10 MG tablet   Oral   Take 10 mg by mouth daily. allergies         . meclizine (ANTIVERT) 25 MG tablet   Oral   Take 25 mg by mouth 3 (three) times daily as needed. For vertigo         . metoprolol succinate (TOPROL-XL) 25 MG 24 hr tablet   Oral   Take 25 mg by mouth daily.         . traMADol (ULTRAM) 50 MG tablet   Oral   Take 50-100 mg by mouth 2 (two) times daily as needed. For pain         . venlafaxine XR (EFFEXOR XR) 37.5 MG 24 hr capsule   Oral   Take 37.5 mg by mouth every evening.          . cephALEXin (KEFLEX) 500 MG capsule   Oral   Take 1 capsule (500 mg total) by mouth 4 (four) times daily.  28 capsule   0   . ibuprofen (ADVIL,MOTRIN) 600 MG tablet   Oral   Take 1 tablet (600 mg total) by mouth every 6 (six) hours as needed for pain.   30 tablet   0    BP 145/86  Pulse 70  Temp(Src) 98.2 F (36.8 C) (Oral)  Resp 18  Ht 5\' 4"  (1.626 m)  Wt 155 lb (70.308 kg)  BMI 26.59 kg/m2  SpO2 100% Physical Exam  Constitutional: She is oriented to person, place, and time. She appears well-developed and well-nourished.  HENT:  Head: Normocephalic and atraumatic.  Right Ear: Tympanic membrane normal. No drainage. Tympanic membrane is not injected.  Left Ear: Tympanic membrane and ear canal normal. No drainage. Tympanic membrane is not injected.  Nose: No mucosal edema or rhinorrhea. Right sinus exhibits no maxillary sinus tenderness and no frontal sinus tenderness. Left sinus exhibits no maxillary sinus tenderness and no frontal sinus tenderness.  Mouth/Throat: Uvula is midline, oropharynx is  clear and moist and mucous membranes are normal. No trismus in the jaw. Normal dentition. No dental abscesses or dental caries. No oropharyngeal exudate, posterior oropharyngeal edema, posterior oropharyngeal erythema or tonsillar abscesses.  Tympanostomy tube is stuck along lateral right external canal in a small amount of cerumen.  TM intact.  Eyes: Conjunctivae are normal.  Cardiovascular: Normal rate and normal heart sounds.   Pulmonary/Chest: Effort normal. No respiratory distress. She has no wheezes. She has no rales.  Abdominal: Soft. There is no tenderness.  Musculoskeletal: Normal range of motion.  Lymphadenopathy:       Head (right side): Tonsillar adenopathy present. No submandibular, no preauricular and no posterior auricular adenopathy present.    She has no cervical adenopathy.  Right tonsillar lymph node is tender,  Slightly larger than left.    Neurological: She is alert and oriented to person, place, and time.  Skin: Skin is warm and dry. No rash noted.  Psychiatric: She has a normal mood and affect.    ED Course  Procedures (including critical care time) Labs Review Labs Reviewed - No data to display Imaging Review No results found.  MDM   1. Lymphadenitis, acute    Pt prescribed keflex,  Ibuprofen,  Encouraged warm compresses,  F/u with ent (scheduled to see in 2 weeks),  Sooner for worsened pain or if new sx develop.  Her tympanostomy tube was easily removed with alligator forceps.    Burgess Amor, PA-C 12/04/12 1651

## 2013-11-26 DIAGNOSIS — F39 Unspecified mood [affective] disorder: Secondary | ICD-10-CM | POA: Insufficient documentation

## 2013-12-26 ENCOUNTER — Encounter (HOSPITAL_COMMUNITY): Payer: Self-pay | Admitting: Emergency Medicine

## 2013-12-26 ENCOUNTER — Emergency Department (HOSPITAL_COMMUNITY): Payer: Medicare Other

## 2013-12-26 ENCOUNTER — Emergency Department (HOSPITAL_COMMUNITY)
Admission: EM | Admit: 2013-12-26 | Discharge: 2013-12-26 | Disposition: A | Discharge status: Home / Self Care | Payer: Medicare Other | Attending: Emergency Medicine | Admitting: Emergency Medicine

## 2013-12-26 DIAGNOSIS — J209 Acute bronchitis, unspecified: Secondary | ICD-10-CM | POA: Insufficient documentation

## 2013-12-26 DIAGNOSIS — Z79899 Other long term (current) drug therapy: Secondary | ICD-10-CM | POA: Diagnosis not present

## 2013-12-26 DIAGNOSIS — Z792 Long term (current) use of antibiotics: Secondary | ICD-10-CM | POA: Insufficient documentation

## 2013-12-26 DIAGNOSIS — R0682 Tachypnea, not elsewhere classified: Secondary | ICD-10-CM | POA: Insufficient documentation

## 2013-12-26 DIAGNOSIS — R042 Hemoptysis: Secondary | ICD-10-CM | POA: Diagnosis present

## 2013-12-26 DIAGNOSIS — J40 Bronchitis, not specified as acute or chronic: Secondary | ICD-10-CM

## 2013-12-26 MED ORDER — HYDROCOD POLST-CHLORPHEN POLST 10-8 MG/5ML PO LQCR: 5.0000 mL | Freq: Once | ORAL | Status: DC

## 2013-12-26 MED ORDER — PREDNISONE 20 MG PO TABS: 40.0000 mg | ORAL_TABLET | Freq: Every day | ORAL | Status: AC

## 2013-12-26 MED ORDER — DOXYCYCLINE HYCLATE 100 MG PO TABS: 100.0000 mg | ORAL_TABLET | Freq: Two times a day (BID) | ORAL | Status: DC

## 2013-12-26 MED ORDER — PREDNISONE 50 MG PO TABS
60.0000 mg | ORAL_TABLET | ORAL | Status: AC
  Administered 2013-12-26: 14:00:00 60 mg via ORAL
  Filled 2013-12-26 (×2): qty 1

## 2013-12-26 MED ORDER — ALBUTEROL SULFATE (2.5 MG/3ML) 0.083% IN NEBU
5.0000 mg | INHALATION_SOLUTION | Freq: Once | RESPIRATORY_TRACT | Status: AC
  Administered 2013-12-26: 5 mg via RESPIRATORY_TRACT
  Filled 2013-12-26: qty 6

## 2013-12-26 MED ORDER — ALBUTEROL SULFATE HFA 108 (90 BASE) MCG/ACT IN AERS: 1.0000 | INHALATION_SPRAY | Freq: Four times a day (QID) | RESPIRATORY_TRACT | Status: DC | PRN

## 2013-12-26 MED ORDER — DOXYCYCLINE HYCLATE 100 MG PO TABS
100.0000 mg | ORAL_TABLET | Freq: Once | ORAL | Status: AC
Start: 2013-12-26 — End: 2013-12-26
  Administered 2013-12-26: 100 mg via ORAL
  Filled 2013-12-26: qty 1

## 2013-12-26 MED ORDER — HYDROCOD POLST-CHLORPHEN POLST 10-8 MG/5ML PO LQCR
5.0000 mL | Freq: Once | ORAL | Status: AC
  Administered 2013-12-26: 5 mL via ORAL
  Filled 2013-12-26: qty 5

## 2013-12-26 NOTE — ED Notes (Signed)
Pt states she has had cough and congestion x2 weeks, using OTC cold and flu medicine. States has had some blood in mucous x 1 day. BP 150/103 in triage.

## 2013-12-26 NOTE — ED Provider Notes (Signed)
CSN: [id number]     Arrival date & time 12/26/13  1210 History   First MD Initiated Contact with Patient 12/26/13 1223     Chief Complaint  Patient presents with  . Hemoptysis     (Consider location/radiation/quality/duration/timing/severity/associated sxs/prior Treatment) HPI  Patient presents with concern of ongoing cough, congestion, hemoptysis. Symptoms began approximately 2 weeks ago, and after a brief improvement have been progressively worse. No relief with multiple OTC medications. Patient also complains of fever, nausea, but no syncope, vomiting. Mild dyspnea with coughing, mild chest pain with coughing, no chest pain otherwise. Patient history smoking in the distant past: Patient purchased 2 parakeets 3 days prior to the onset of symptoms.   History reviewed. No pertinent past medical history. Past Surgical History  Procedure Laterality Date  . Joint replacement    . Hip surgery    . Abdominal hysterectomy     History reviewed. No pertinent family history. History  Substance Use Topics  . Smoking status: Never Smoker   . Smokeless tobacco: Not on file  . Alcohol Use: Yes     Comment: occ   OB History   Grav Para Term Preterm Abortions TAB SAB Ect Mult Living                 Review of Systems  Constitutional:       Per HPI, otherwise negative  HENT:       Per HPI, otherwise negative  Respiratory:       Per HPI, otherwise negative  Cardiovascular:       Per HPI, otherwise negative  Gastrointestinal: Negative for vomiting.  Endocrine:       Negative aside from HPI  Genitourinary:       Neg aside from HPI   Musculoskeletal:       Per HPI, otherwise negative  Skin: Negative.   Neurological: Negative for syncope.      Allergies  Review of patient's allergies indicates no known allergies.  Home Medications   Prior to Admission medications   Medication Sig Start Date End Date Taking? Authorizing Provider  albuterol (PROVENTIL HFA;VENTOLIN HFA)  108 (90 BASE) MCG/ACT inhaler Inhale 1-2 puffs into the lungs every 6 (six) hours as needed for wheezing or shortness of breath. 12/26/13   Gerhard Munch, MD  chlorpheniramine-HYDROcodone (TUSSIONEX) 10-8 MG/5ML LQCR Take 5 mLs by mouth once. 12/26/13   Gerhard Munch, MD  doxycycline (VIBRA-TABS) 100 MG tablet Take 1 tablet (100 mg total) by mouth 2 (two) times daily. 12/26/13   Gerhard Munch, MD  ibuprofen (ADVIL,MOTRIN) 600 MG tablet Take 1 tablet (600 mg total) by mouth every 6 (six) hours as needed for pain. 12/04/12   Burgess Amor, PA-C  lidocaine (LIDODERM) 5 % Place 1 patch onto the skin daily. Place 1 patch onto the skin daily. Remove & Discard patch within 12 hours or as directed by MD 11/25/12   Historical Provider, MD  loratadine (CLARITIN) 10 MG tablet Take 10 mg by mouth daily. allergies    Historical Provider, MD  meclizine (ANTIVERT) 25 MG tablet Take 25 mg by mouth 3 (three) times daily as needed. For vertigo    Historical Provider, MD  metoprolol succinate (TOPROL-XL) 25 MG 24 hr tablet Take 25 mg by mouth daily. 10/21/12   Historical Provider, MD  predniSONE (DELTASONE) 20 MG tablet Take 2 tablets (40 mg total) by mouth daily with breakfast. 12/27/13 12/30/13  Gerhard Munch, MD  traMADol (ULTRAM) 50 MG tablet Take 50-100 mg by  mouth 2 (two) times daily as needed. For pain    Historical Provider, MD  venlafaxine XR (EFFEXOR XR) 37.5 MG 24 hr capsule Take 37.5 mg by mouth every evening.  11/25/12 11/25/13  Historical Provider, MD   BP 150/103  Pulse 78  Temp(Src) 98.4 F (36.9 C) (Oral)  Resp 24  Ht 5\' 4"  (1.626 m)  Wt 160 lb (72.576 kg)  BMI 27.45 kg/m2  SpO2 100% Physical Exam  Nursing note and vitals reviewed. Constitutional: She is oriented to person, place, and time. She appears well-developed and well-nourished. No distress.  HENT:  Head: Normocephalic and atraumatic.  Nose: Nose normal.  Mouth/Throat: Oropharynx is clear and moist. No oropharyngeal exudate.  Eyes:  Conjunctivae and EOM are normal.  Cardiovascular: Normal rate and regular rhythm.   Pulmonary/Chest: No stridor. Tachypnea noted. No respiratory distress. She has decreased breath sounds.  Abdominal: She exhibits no distension.  Musculoskeletal: She exhibits no edema.  Neurological: She is alert and oriented to person, place, and time. No cranial nerve deficit.  Skin: Skin is warm and dry.  Psychiatric: She has a normal mood and affect.    ED Course  Procedures (including critical care time)  Imaging Review Dg Chest 2 View  12/26/2013   CLINICAL DATA:  Productive cough. Intermittent hemoptysis. Chest congestion. Shortness of breath.  EXAM: CHEST  2 VIEW  COMPARISON:  01/20/2008.  FINDINGS: Normal sized heart. Clear lungs. Minimal diffuse peribronchial thickening. Unremarkable bones.  IMPRESSION: Minimal bronchitic changes.   Electronically Signed   By: 13/12/2007 M.D.   On: 12/26/2013 13:22    On repeat exam the patient appears slightly better. She remains afebrile, hemodynamically stable.  We had a lengthy conversation about her illness, prognosis, treatment, empiric therapy for bronchitis.  MDM   Final diagnoses:  Bronchitis    Patient presents with concerns of persistent cough, congestion, chills, occasional hemoptysis. Patient's exposure to bird, just prior to the onset of symptoms suggests psittacosis, though this is rare.  With the duration of the illness, however the patient will receive antibiotics in addition to steroids, antitussive. No evidence for bacteremia, sepsis, other acute pathology.  Patient discharged in stable condition to follow up with primary care.     12/28/2013, MD 12/26/13 1351

## 2013-12-26 NOTE — Discharge Instructions (Signed)
As discussed, it is very important that you monitor your condition carefully, and do not hesitate to return here for concerning changes in your condition.

## 2013-12-26 NOTE — ED Notes (Signed)
Patient with no complaints at this time. Respirations even and unlabored. Skin warm/dry. Discharge instructions reviewed with patient at this time. Patient given opportunity to voice concerns/ask questions. Patient discharged at this time and left Emergency Department with steady gait.   

## 2014-03-02 ENCOUNTER — Emergency Department (HOSPITAL_COMMUNITY): Payer: Medicare Other

## 2014-03-02 ENCOUNTER — Encounter (HOSPITAL_COMMUNITY): Payer: Self-pay | Admitting: Emergency Medicine

## 2014-03-02 ENCOUNTER — Emergency Department (HOSPITAL_COMMUNITY)
Admission: EM | Admit: 2014-03-02 | Discharge: 2014-03-02 | Disposition: A | Discharge status: Home / Self Care | Payer: Medicare Other | Attending: Emergency Medicine | Admitting: Emergency Medicine

## 2014-03-02 DIAGNOSIS — K429 Umbilical hernia without obstruction or gangrene: Secondary | ICD-10-CM | POA: Diagnosis not present

## 2014-03-02 DIAGNOSIS — Z87891 Personal history of nicotine dependence: Secondary | ICD-10-CM | POA: Diagnosis not present

## 2014-03-02 DIAGNOSIS — Z79899 Other long term (current) drug therapy: Secondary | ICD-10-CM | POA: Insufficient documentation

## 2014-03-02 DIAGNOSIS — R109 Unspecified abdominal pain: Secondary | ICD-10-CM

## 2014-03-02 DIAGNOSIS — R1033 Periumbilical pain: Secondary | ICD-10-CM

## 2014-03-02 LAB — COMPREHENSIVE METABOLIC PANEL
ALT: 17 U/L (ref 0–35)
AST: 24 U/L (ref 0–37)
Albumin: 4.1 g/dL (ref 3.5–5.2)
Alkaline Phosphatase: 71 U/L (ref 39–117)
Anion gap: 5 (ref 5–15)
BUN: 15 mg/dL (ref 6–23)
CALCIUM: 9.1 mg/dL (ref 8.4–10.5)
CO2: 27 mmol/L (ref 19–32)
Chloride: 107 mEq/L (ref 96–112)
Creatinine, Ser: 0.61 mg/dL (ref 0.50–1.10)
GFR calc Af Amer: >90 mL/min (ref >90)
Glucose, Bld: 88 mg/dL (ref 70–99)
Potassium: 3.8 mmol/L (ref 3.5–5.1)
Sodium: 139 mmol/L (ref 135–145)
Total Bilirubin: 0.3 mg/dL (ref 0.3–1.2)
Total Protein: 7.2 g/dL (ref 6.0–8.3)

## 2014-03-02 LAB — CBC WITH DIFFERENTIAL/PLATELET
Basophils Absolute: 0 10*3/uL (ref 0.0–0.1)
Basophils Relative: 0 % (ref 0–1)
EOS PCT: 2 % (ref 0–5)
Eosinophils Absolute: 0.2 10*3/uL (ref 0.0–0.7)
HEMATOCRIT: 36.8 % (ref 36.0–46.0)
Hemoglobin: 12.5 g/dL (ref 12.0–15.0)
LYMPHS ABS: 3.6 10*3/uL (ref 0.7–4.0)
LYMPHS PCT: 48 % — AB (ref 12–46)
MCH: 32.1 pg (ref 26.0–34.0)
MCHC: 34 g/dL (ref 30.0–36.0)
MCV: 94.6 fL (ref 78.0–100.0)
MONO ABS: 0.6 10*3/uL (ref 0.1–1.0)
MONOS PCT: 9 % (ref 3–12)
Neutro Abs: 3.1 10*3/uL (ref 1.7–7.7)
Neutrophils Relative %: 41 % — ABNORMAL LOW (ref 43–77)
Platelets: 213 10*3/uL (ref 150–400)
RBC: 3.89 MIL/uL (ref 3.87–5.11)
RDW: 13.3 % (ref 11.5–15.5)
WBC: 7.4 10*3/uL (ref 4.0–10.5)

## 2014-03-02 LAB — LIPASE, BLOOD: Lipase: 37 U/L (ref 11–59)

## 2014-03-02 MED ORDER — IOHEXOL 300 MG/ML  SOLN
100.0000 mL | Freq: Once | INTRAMUSCULAR | Status: AC | PRN
  Administered 2014-03-02: 100 mL via INTRAVENOUS

## 2014-03-02 MED ORDER — SODIUM CHLORIDE 0.9 % IV SOLN
INTRAVENOUS | Status: DC
  Administered 2014-03-02: 20:00:00 via INTRAVENOUS

## 2014-03-02 MED ORDER — SODIUM CHLORIDE 0.9 % IV BOLUS (SEPSIS)
250.0000 mL | Freq: Once | INTRAVENOUS | Status: AC
Start: 2014-03-02 — End: 2014-03-02
  Administered 2014-03-02: 250 mL via INTRAVENOUS

## 2014-03-02 MED ORDER — ONDANSETRON HCL 4 MG/2ML IJ SOLN
4.0000 mg | Freq: Once | INTRAMUSCULAR | Status: AC
  Administered 2014-03-02: 4 mg via INTRAVENOUS
  Filled 2014-03-02: qty 2

## 2014-03-02 MED ORDER — IOHEXOL 300 MG/ML  SOLN
50.0000 mL | Freq: Once | INTRAMUSCULAR | Status: AC | PRN
  Administered 2014-03-02: 25 mL via ORAL

## 2014-03-02 NOTE — Discharge Instructions (Signed)
As we discussed as a possibility of a small of hernia right above the umbilicus where he had a hernia in the past. No evidence of any significant complications. They can point to follow-up with general surgery. Return for any new or worse symptoms. Take tramadol as needed for pain.

## 2014-03-02 NOTE — ED Notes (Signed)
Pt reports feeling a hardness over the umbilical area of her abdomen. Pt denies difficulty with swallowing or eating, reports abdominal tenderness. Denies n/v/d. Denies fever.

## 2014-03-02 NOTE — ED Provider Notes (Signed)
CSN: [id number]     Arrival date & time 03/02/14  1637 History   First MD Initiated Contact with Patient 03/02/14 1834     Chief Complaint  Patient presents with  . Abdominal Pain     (Consider location/radiation/quality/duration/timing/severity/associated sxs/prior Treatment) Patient is a 63 y.o. female presenting with abdominal pain. The history is provided by the patient.  Abdominal Pain Associated symptoms: no chest pain, no dysuria, no fever, no nausea, no shortness of breath and no vomiting    patient presents with concern for an area of hardness and discomfort just above her umbilicus. To palpate that area causes tenderness for her. No nausea vomiting or diarrhea no fever. Pain with palpation at worse is an 8 out of 10 not particularly tender without palpation. Patient relates having an umbilical hernia in the past  History reviewed. No pertinent past medical history. Past Surgical History  Procedure Laterality Date  . Joint replacement    . Hip surgery    . Abdominal hysterectomy     Family History  Problem Relation Age of Onset  . Mental illness Mother   . Mental illness Brother   . Mental illness Other    History  Substance Use Topics  . Smoking status: Former Games developer  . Smokeless tobacco: Never Used  . Alcohol Use: Yes     Comment: occ   OB History    Gravida Para Term Preterm AB TAB SAB Ectopic Multiple Living   5 5 5             Review of Systems  Constitutional: Negative for fever.  HENT: Negative for congestion.   Eyes: Negative for redness.  Respiratory: Negative for shortness of breath.   Cardiovascular: Negative for chest pain.  Gastrointestinal: Positive for abdominal pain. Negative for nausea and vomiting.  Genitourinary: Negative for dysuria.  Musculoskeletal: Negative for back pain.  Skin: Negative for rash.  Neurological: Negative for headaches.  Hematological: Bruises/bleeds easily.  Psychiatric/Behavioral: Negative for confusion.       Allergies  Review of patient's allergies indicates no known allergies.  Home Medications   Prior to Admission medications   Medication Sig Start Date End Date Taking? Authorizing Provider  aspirin-acetaminophen-caffeine (EXCEDRIN EXTRA STRENGTH) [phone] MG per tablet Take 2 tablets by mouth every 6 (six) hours as needed for headache.   Yes Historical Provider, MD  metoprolol succinate (TOPROL-XL) 25 MG 24 hr tablet Take 25 mg by mouth daily as needed (for blood pressure).  10/21/12  Yes Historical Provider, MD  traMADol (ULTRAM) 50 MG tablet Take 50-100 mg by mouth 2 (two) times daily as needed. For pain   Yes Historical Provider, MD  albuterol (PROVENTIL HFA;VENTOLIN HFA) 108 (90 BASE) MCG/ACT inhaler Inhale 1-2 puffs into the lungs every 6 (six) hours as needed for wheezing or shortness of breath. 12/26/13   12/28/13, MD  chlorpheniramine-HYDROcodone (TUSSIONEX) 10-8 MG/5ML LQCR Take 5 mLs by mouth once. Patient not taking: Reported on 03/02/2014 12/26/13   12/28/13, MD  doxycycline (VIBRA-TABS) 100 MG tablet Take 1 tablet (100 mg total) by mouth 2 (two) times daily. Patient not taking: Reported on 03/02/2014 12/26/13   12/28/13, MD  ibuprofen (ADVIL,MOTRIN) 600 MG tablet Take 1 tablet (600 mg total) by mouth every 6 (six) hours as needed for pain. Patient not taking: Reported on 03/02/2014 12/04/12   12/06/12, PA-C  venlafaxine XR (EFFEXOR XR) 37.5 MG 24 hr capsule Take 37.5 mg by mouth every evening.  11/25/12 11/25/13  Historical  Provider, MD   BP 140/81 mmHg  Pulse 72  Temp(Src) 98.8 F (37.1 C) (Oral)  Resp 18  Ht 5\' 4"  (1.626 m)  Wt 160 lb (72.576 kg)  BMI 27.45 kg/m2  SpO2 100% Physical Exam  Constitutional: She is oriented to person, place, and time. She appears well-developed and well-nourished. No distress.  HENT:  Head: Normocephalic and atraumatic.  Mouth/Throat: Oropharynx is clear and moist.  Eyes: Conjunctivae and EOM are normal.  Pupils are equal, round, and reactive to light.  Neck: Normal range of motion.  Cardiovascular: Normal rate and normal heart sounds.   No murmur heard. Pulmonary/Chest: Effort normal and breath sounds normal.  Abdominal: Soft. Bowel sounds are normal. She exhibits no mass. There is tenderness.  Patient with area of mild tenderness just superior and to the left of the umbilicus. No palpable mass. No erythema. No hernia defect noted.  Musculoskeletal: Normal range of motion.  Neurological: She is alert and oriented to person, place, and time. No cranial nerve deficit. She exhibits normal muscle tone. Coordination normal.  Skin: Skin is warm. No rash noted.  Nursing note and vitals reviewed.   ED Course  Procedures (including critical care time) Labs Review Labs Reviewed  CBC WITH DIFFERENTIAL - Abnormal; Notable for the following:    Neutrophils Relative % 41 (*)    Lymphocytes Relative 48 (*)    All other components within normal limits  COMPREHENSIVE METABOLIC PANEL  LIPASE, BLOOD    Imaging Review Ct Abdomen Pelvis W Contrast  03/02/2014   CLINICAL DATA:  Palpable mass in the region of the umbilicus.  EXAM: CT ABDOMEN AND PELVIS WITH CONTRAST  TECHNIQUE: Multidetector CT imaging of the abdomen and pelvis was performed using the standard protocol following bolus administration of intravenous contrast.  CONTRAST:  11mL OMNIPAQUE IOHEXOL 300 MG/ML SOLN, 32m OMNIPAQUE IOHEXOL 300 MG/ML SOLN  COMPARISON:  No similar prior exam is available at this institution for comparison or on .  FINDINGS: 1.5 cm superior to the umbilicus, there is minimal linear subcutaneous fat stranding, for example image 48 series 5 and image 37 series 2. Allowing for the static nature of this examination, no herniation of intra-abdominal contents is identified. No bowel dilatation to suggest obstruction.  Lung bases are clear.  Within the liver, there are 4 lesions with early arterial partial enhancement,  2 in the lateral segment left hepatic lobe measuring 1.9 cm and 1.5 cm respectively on image 17, and 1 within the medial segment left hepatic lobe measuring 1.1 cm image 17. A 0.7 cm subjectively fluid density probable cyst is noted at the dome of the left hepatic lobe image 11. There is partial fill-in of the 1.5 cm left hepatic lobe mass at delayed renal imaging and the other 2 lesions are not visualized at delayed imaging, all suggestive of benign-appearing hepatic hemangiomas. The fourth area of subtle hyper enhancement within the posterior segment right hepatic lobe measures 1.6 cm on image 24 which could represent a flash filling hemangioma or perfusion anomaly. Adrenal glands, kidneys, spleen, and pancreas appear normal. No free air or fluid.  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 Uterus and ovaries not visualized, presumably surgically absent. No bowel wall thickening or focal segmental dilatation is identified. Appendix not identified but there is no secondary evidence for acute appendicitis. No free air or fluid.  No acute osseous abnormality.  IMPRESSION: 1.5 cm superior to the umbilicus, is an area of ill-defined subcutaneous  fat stranding which could correspond to the reported palpable mass. Differential considerations include fat necrosis from possible trauma or spontaneous development, or a microscopic ventral abdominal wall hernia not seen at today static exam. There is no measurable mass at this area or intra-abdominal or pelvic pathology.  Hypervascular hepatic lesions with imaging characteristics typical for benign hemangiomas. No further specific imaging workup is likely necessary for these probably benign findings.   Electronically Signed   By: Christiana Pellant M.D.   On: 03/02/2014 21:00     EKG Interpretation None      MDM   Final diagnoses:  Abdominal pain  Umbilical hernia, recurrence not specified    Based on CT scan possible a  small ventral hernia just superior to the umbilicus. No evidence of any bowel obstruction or strangulation. Possible other could be some necrosing adipose in that area. Patient given reassurance. Patient is tramadol at home. Patient given referral to follow-up with general surgery. Cautions provided for return.    Vanetta Mulders, MD 03/02/14 2128

## 2015-02-18 DIAGNOSIS — I7300 Raynaud's syndrome without gangrene: Secondary | ICD-10-CM | POA: Insufficient documentation

## 2015-07-05 DIAGNOSIS — R768 Other specified abnormal immunological findings in serum: Secondary | ICD-10-CM | POA: Insufficient documentation

## 2015-07-05 DIAGNOSIS — R7689 Other specified abnormal immunological findings in serum: Secondary | ICD-10-CM | POA: Insufficient documentation

## 2015-10-24 ENCOUNTER — Emergency Department (HOSPITAL_COMMUNITY): Payer: Medicare Other

## 2015-10-24 ENCOUNTER — Encounter (HOSPITAL_COMMUNITY): Payer: Self-pay | Admitting: Emergency Medicine

## 2015-10-24 ENCOUNTER — Emergency Department (HOSPITAL_COMMUNITY)
Admission: EM | Admit: 2015-10-24 | Discharge: 2015-10-24 | Disposition: A | Discharge status: Home / Self Care | Payer: Medicare Other | Attending: Emergency Medicine | Admitting: Emergency Medicine

## 2015-10-24 DIAGNOSIS — Z79899 Other long term (current) drug therapy: Secondary | ICD-10-CM | POA: Insufficient documentation

## 2015-10-24 DIAGNOSIS — Z7982 Long term (current) use of aspirin: Secondary | ICD-10-CM | POA: Insufficient documentation

## 2015-10-24 DIAGNOSIS — I10 Essential (primary) hypertension: Secondary | ICD-10-CM | POA: Diagnosis not present

## 2015-10-24 DIAGNOSIS — M25551 Pain in right hip: Secondary | ICD-10-CM | POA: Diagnosis present

## 2015-10-24 DIAGNOSIS — Z87891 Personal history of nicotine dependence: Secondary | ICD-10-CM | POA: Diagnosis not present

## 2015-10-24 HISTORY — DX: Disorder of thyroid, unspecified: E07.9

## 2015-10-24 HISTORY — DX: Essential (primary) hypertension: I10

## 2015-10-24 HISTORY — DX: Migraine, unspecified, not intractable, without status migrainosus: G43.909

## 2015-10-24 MED ORDER — HYDROCODONE-ACETAMINOPHEN 5-325 MG PO TABS: 1.0000 | ORAL_TABLET | ORAL | 0 refills | Status: DC | PRN

## 2015-10-24 MED ORDER — KETOROLAC TROMETHAMINE 60 MG/2ML IM SOLN
30.0000 mg | Freq: Once | INTRAMUSCULAR | Status: AC
  Administered 2015-10-24: 30 mg via INTRAMUSCULAR
  Filled 2015-10-24: qty 2

## 2015-10-24 NOTE — ED Provider Notes (Signed)
[facility]Y DEPT Provider Note   CSN: [id number] Arrival date & time: 10/24/15  1150     History   Chief Complaint Chief Complaint  Patient presents with  . Hip Pain    HPI Rhonda [name] is a 65 y.o. female.  Rhonda [name] is a 65 y.o. Female presenting with an approximate one week history of worsening right upper anterior thigh and groin pain associated with movement and weight bearing. She denies new injury, twists, overuse or long periods of standing or walking. She has a history of bilateral hip replacement surgery at [facility] in 2012 and denies any complications or problems with pain since these surgeries.  She denies radiation of pain.  She has found no alleviators for her symptoms.     The history is provided by the patient.    Past Medical History:  Diagnosis Date  . Hypertension   . Migraine   . Thyroid disease     Patient Active Problem List   Diagnosis Date Noted  . Stiffness of joint, not elsewhere classified, pelvic region and thigh 03/14/2011  . Abnormality of gait 03/14/2011  . Weakness of both legs 03/14/2011    Past Surgical History:  Procedure Laterality Date  . ABDOMINAL HYSTERECTOMY    . HERNIA REPAIR    . HIP SURGERY    . JOINT REPLACEMENT      OB History    Gravida Para Term Preterm AB Living   5 5 5     5    SAB TAB Ectopic Multiple Live Births                   Home Medications    Prior to Admission medications   Medication Sig Start Date End Date Taking? Authorizing Provider  aspirin-acetaminophen-caffeine (EXCEDRIN EXTRA STRENGTH) [phone] MG per tablet Take 2 tablets by mouth every 6 (six) hours as needed for headache.   Yes Historical Provider, MD  levothyroxine (SYNTHROID, LEVOTHROID) 25 MCG tablet Take 25 mcg by mouth daily before breakfast.  10/01/15  Yes Historical Provider, MD  metoprolol succinate (TOPROL-XL) 25 MG 24 hr tablet Take 25 mg by mouth daily as needed (for blood pressure).  10/21/12  Yes Historical  Provider, MD  traMADol (ULTRAM) 50 MG tablet Take 50-100 mg by mouth 2 (two) times daily as needed. For pain   Yes Historical Provider, MD  traZODone (DESYREL) 50 MG tablet Take 50 mg by mouth at bedtime.  10/17/15  Yes Historical Provider, MD  HYDROcodone-acetaminophen (NORCO/VICODIN) 5-325 MG tablet Take 1 tablet by mouth every 4 (four) hours as needed. 10/24/15   10/26/15, PA-C    Family History Family History  Problem Relation Age of Onset  . Mental illness Mother   . Mental illness Brother   . Mental illness Other     Social History Social History  Substance Use Topics  . Smoking status: Former Burgess Amor  . Smokeless tobacco: Never Used  . Alcohol use Yes     Comment: occ     Allergies   Review of patient's allergies indicates no known allergies.   Review of Systems Review of Systems  Constitutional: Negative for fever.  Musculoskeletal: Positive for arthralgias. Negative for joint swelling and myalgias.  Neurological: Negative for weakness and numbness.     Physical Exam Updated Vital Signs BP 118/67 (BP Location: Left Arm)   Pulse 71   Temp 98.6 F (37 C) (Oral)   Resp 16   Ht 5\' 4"  (1.626 m)  Wt 74.8 kg   SpO2 98%   BMI 28.32 kg/m   Physical Exam  Constitutional: She appears well-developed and well-nourished.  HENT:  Head: Atraumatic.  Neck: Normal range of motion.  Cardiovascular:  Pulses equal bilaterally  Musculoskeletal: She exhibits tenderness.       Right hip: She exhibits decreased range of motion and tenderness. She exhibits no crepitus and no deformity.  Pain to palpation anterior proximal thigh.  No lateral or posterior thigh pain.  Groin pain increased with hip external rotation.  Right knee, calf ankle and foot nontender.  No distal edema, dorsalis pedal pulse full.    Neurological: She is alert. She has normal strength. She displays normal reflexes. No sensory deficit.  Skin: Skin is warm and dry.  Psychiatric: She has a normal mood and  affect.     ED Treatments / Results  Labs (all labs ordered are listed, but only abnormal results are displayed) Labs Reviewed - No data to display  EKG  EKG Interpretation None       Radiology Dg Hip Unilat  With Pelvis 2-3 Views Right  Result Date: 10/24/2015 CLINICAL DATA:  65 year old female with a history of right hip pain EXAM: DG HIP (WITH OR WITHOUT PELVIS) 2-3V RIGHT COMPARISON:  11/23/2011 FINDINGS: Bony pelvic ring intact.  No acute fracture identified. Surgical changes of bilateral hip arthroplasty. Components maintain alignment. No perihardware fracture. Margin of lucency surrounding the proximal femoral component on the right hip, progressed from the comparison plain film of 2013 No unexpected radiopaque foreign body. No unexpected soft tissue density. IMPRESSION: No acute bony abnormality identified. There appears to be progressing lucency at the proximal right femoral component, suggesting element of loosening, progressed from plain film of 11/23/2011. Signed, Yvone Neu. Loreta Ave, DO [facility] Electronically Signed   By: Gilmer Mor D.O.   On: 10/24/2015 13:33    Procedures Procedures (including critical care time)  Medications Ordered in ED Medications  ketorolac (TORADOL) injection 30 mg (30 mg Intramuscular Given 10/24/15 1500)     Initial Impression / Assessment and Plan / ED Course  I have reviewed the triage vital signs and the nursing notes.  Pertinent labs & imaging results that were available during my care of the patient were reviewed by me and considered in my medical decision making (see chart for details).  Clinical Course    Discussed with Dr. Dorris Carnes with [facility].  Dr. Roxan Diesel her original ortho surgeon now at [facility].  Can follow either facility .  Pt has a preference to stay with [facility], referral given to re-establish care with ortho at [facility].  She was prescribed hydrocodone prn pain.  Recommended activity as tolerated per Dr Dorris Carnes. Pt has cane which will use prn.    Final Clinical Impressions(s) / ED Diagnoses   Final diagnoses:  Hip pain, acute, right    New Prescriptions Discharge Medication List as of 10/24/2015  3:42 PM    START taking these medications   Details  HYDROcodone-acetaminophen (NORCO/VICODIN) 5-325 MG tablet Take 1 tablet by mouth every 4 (four) hours as needed., Starting Mon 10/24/2015, Print         Burgess Amor, PA-C 10/24/15 1650    Glynn Octave, MD 10/24/15 1709

## 2015-10-24 NOTE — ED Triage Notes (Signed)
PT c/o right hip pain worsening x1 week. PT states more pain to right hip after getting out of bed yesterday. PT states she drove herself to the ED today for evaluation and has history of bilateral hip replacements 2012.

## 2015-10-24 NOTE — Discharge Instructions (Signed)
You may take the hydrocodone prescribed for pain relief.  This will make you drowsy - do not drive within 4 hours of taking this medication.  Use your cane to assist with walking until your re-evaluation appointment.

## 2016-01-11 DIAGNOSIS — I10 Essential (primary) hypertension: Secondary | ICD-10-CM | POA: Insufficient documentation

## 2016-01-11 DIAGNOSIS — E063 Autoimmune thyroiditis: Secondary | ICD-10-CM | POA: Insufficient documentation

## 2016-01-11 DIAGNOSIS — G43909 Migraine, unspecified, not intractable, without status migrainosus: Secondary | ICD-10-CM | POA: Insufficient documentation

## 2016-01-24 ENCOUNTER — Ambulatory Visit (HOSPITAL_COMMUNITY): Payer: Medicare Other | Attending: Physician Assistant | Admitting: Physical Therapy

## 2016-01-24 DIAGNOSIS — M25551 Pain in right hip: Secondary | ICD-10-CM

## 2016-01-24 DIAGNOSIS — R262 Difficulty in walking, not elsewhere classified: Secondary | ICD-10-CM | POA: Diagnosis present

## 2016-01-24 NOTE — Therapy (Signed)
[facility] [address] [place], [place], [address] Phone: [phone]   Fax:  [phone]  Physical Therapy Evaluation  Patient Details  Name: Rhonda [name] MRN: [id number] Date of Birth: 23-Sep-1950 Referring Provider: Redmond Baseman  Encounter Date: 01/24/2016      PT End of Session - 01/24/16 1211    Visit Number 1   Number of Visits 1   Authorization Type medicare   Authorization - Visit Number 1   Authorization - Number of Visits 1   PT Start Time 1030   PT Stop Time 1110   PT Time Calculation (min) 40 min   Activity Tolerance Patient tolerated treatment well      Past Medical History:  Diagnosis Date  . Hypertension   . Migraine   . Thyroid disease     Past Surgical History:  Procedure Laterality Date  . ABDOMINAL HYSTERECTOMY    . HERNIA REPAIR    . HIP SURGERY    . JOINT REPLACEMENT      There were no vitals filed for this visit.       Subjective Assessment - 01/24/16 1042    Subjective Rhonda [name] states that she had both hips replaced in 2012 and did well up until last year.  She noticed that she was limping if she walked a long time.  The pain and limping became progressive.  She went to the ER who noted that the Rt THR needed to be redone.  She is scheduled to have the revision completed on 02/06/2016 at [facility].     Pertinent History OA    How long can you sit comfortably? sit with weight on right LE.  Max would be 15-30 minutes    How long can you stand comfortably? will shift weight but able to stand for 30 minutes    How long can you walk comfortably? for 20-30 minutes at a time    Patient Stated Goals To have a successful revision    Currently in Pain? Yes   Pain Score 3   up to an 8/10    Pain Location Hip   Pain Orientation Right   Pain Descriptors / Indicators Aching   Pain Onset More than a month ago   Pain Frequency Constant   Aggravating Factors  sitting and walking    Pain Relieving  Factors lying down    Effect of Pain on Daily Activities increases             [facility] PT Assessment - 01/24/16 0001      Assessment   Medical Diagnosis failed Rt THR   Referring Provider Redmond Baseman   Onset Date/Surgical Date 01/24/15   Next MD Visit 02/06/2016   Prior Therapy none     Precautions   Precautions None     Restrictions   Weight Bearing Restrictions No     Balance Screen   Has the patient fallen in the past 6 months No   Has the patient had a decrease in activity level because of a fear of falling?  No   Is the patient reluctant to leave their home because of a fear of falling?  No     Home Environment   Living Environment Private residence   Home Access Level entry   Home Layout Able to live on main level with bedroom/bathroom     Prior Function   Level of Independence Independent   Vocation Retired   Leisure jig saw  puzzles      Cognition   Overall Cognitive Status Within Functional Limits for tasks assessed     Observation/Other Assessments-Edema    Edema --  noted increased swelling in Rt hip/thigh area      Functional Tests   Functional tests Single leg stance     Single Leg Stance   Comments LT: 60"; RT 37"     ROM / Strength   AROM / PROM / Strength Strength     Strength   Strength Assessment Site Hip;Knee;Ankle   Right/Left Hip Right;Left   Right Hip Flexion 2/5   Right Hip Extension 4/5   Right Hip ABduction 3/5   Left Hip Flexion 5/5   Right/Left Knee Right;Left   Right Knee Flexion 4+/5   Right Knee Extension 3/5   Right/Left Ankle Right;Left   Right Ankle Dorsiflexion --  5-/5   Left Ankle Dorsiflexion 5/5     Ambulation/Gait   Ambulation/Gait Yes   Gait Comments antalgic gait due to pain.                     [facility] Adult PT Treatment/Exercise - 01/24/16 0001      Exercises   Exercises Knee/Hip     Knee/Hip Exercises: Standing   Heel Raises Both;10 reps     Knee/Hip Exercises: Seated   Long Arc Quad  Right;5 reps     Knee/Hip Exercises: Supine   Straight Leg Raises Limitations bent knee raise x 5  B     Knee/Hip Exercises: Sidelying   Hip ABduction 5 reps     Knee/Hip Exercises: Prone   Hip Extension Both;10 reps                PT Education - 01/24/16 1211    Education provided Yes   Education Details HEP   Person(s) Educated Patient   Methods Explanation   Comprehension Verbalized understanding          PT Short Term Goals - 01/24/16 1216      PT SHORT TERM GOAL #1   Title Pt to be Independent in HEP for optimal strength prior to surgery    Time 1   Period Weeks   Status New                  Plan - 01/24/16 1212    Clinical Impression Statement Rhonda [name] is a 65 yo female who had two THR in the past.  About a year ago she began having some stiffness and discomfort in her Rt hip and thigh.  The pain progressed to where she was unable to walk without a limp therefore she sought medical attention.  Her Rt THR has failed and she is scheduled for a revision on 02/06/2016.  She has been referred to skilled rehabilitiation services for preop consultation.  Due to having two previous THR the pt voices no concerns or questions on how to use a walker.  She was given a HEP to work on strengthening her current weakened musculature.     Rehab Potential Good   PT Frequency 1x / week   PT Duration --  1 week    PT Treatment/Interventions Patient/family education;Therapeutic exercise   PT Next Visit Plan One time visit; discharge pt .       Patient will benefit from skilled therapeutic intervention in order to improve the following deficits and impairments:  Abnormal gait, Decreased activity tolerance, Decreased strength, Difficulty walking, Pain  Visit  Diagnosis: Pain in right hip - Plan: PT plan of care cert/re-cert  Difficulty in walking, not elsewhere classified - Plan: PT plan of care cert/re-cert      G-Codes - February 15, 2016 1215/07/18    Functional Limitation  Mobility: Walking and moving around   Mobility: Walking and Moving Around Current Status [phone]) At least 40 percent but less than 60 percent impaired, limited or restricted   Mobility: Walking and Moving Around Goal Status [phone]) At least 40 percent but less than 60 percent impaired, limited or restricted   Mobility: Walking and Moving Around Discharge Status [phone]) At least 40 percent but less than 60 percent impaired, limited or restricted       Problem List Patient Active Problem List   Diagnosis Date Noted  . Stiffness of joint, not elsewhere classified, pelvic region and thigh 03/14/2011  . Abnormality of gait 03/14/2011  . Weakness of both legs 03/14/2011    Virgina Organ, PT CLT [phone] 2016/02/15, 12:21 PM  [facility] [address] [place], [place], [address] Phone: [phone]   Fax:  [phone]  Name: Rhonda [name] MRN: [id number] Date of Birth: 1950-08-12

## 2016-01-24 NOTE — Patient Instructions (Addendum)
Knee Extension (Sitting)    Place _2___ pound weight on left ankle and straighten knee fully, lower slowly. Repeat _10___ times per set. Do __1__ sets per session. Do __2__ sessions per day.  http://orth.exer.us/732   Copyright  VHI. All rights reserved.  Bent Leg Lift (Hook-Lying)    Tighten stomach and slowly raise right leg __5__ inches from floor. Keep trunk rigid. Hold __3__ seconds. Repeat __10__ times per set. Do _1___ sets per session. Do _2___ sessions per day.  http://orth.exer.us/1090   Copyright  VHI. All rights reserved.  Strengthening: Hip Abduction (Side-Lying)    Tighten muscles on front of right thigh, then lift leg __3__ inches from surface, keeping knee locked.  Repeat _10___ times per set. Do _1___ sets per session. Do ___3_ sessions per day.  http://orth.exer.us/622   Copyright  VHI. All rights reserved.  Strengthening: Hip Extension (Prone)    Tighten muscles on front of right  thigh, then lift leg __2__ inches from surface, keeping knee locked. Repeat _10___ times per set. Do ___1_ sets per session. Do ___2_ sessions per day.  http://orth.exer.us/620   Copyright  VHI. All rights reserved.

## 2016-02-14 ENCOUNTER — Ambulatory Visit (HOSPITAL_COMMUNITY): Payer: Medicare Other | Attending: Physician Assistant | Admitting: Physical Therapy

## 2016-02-14 DIAGNOSIS — M25651 Stiffness of right hip, not elsewhere classified: Secondary | ICD-10-CM | POA: Diagnosis present

## 2016-02-14 DIAGNOSIS — R262 Difficulty in walking, not elsewhere classified: Secondary | ICD-10-CM | POA: Diagnosis present

## 2016-02-14 DIAGNOSIS — M25551 Pain in right hip: Secondary | ICD-10-CM | POA: Diagnosis present

## 2016-02-14 NOTE — Therapy (Signed)
[place] [facility] [address] [place], [place], [address] Phone: [phone]   Fax:  [phone]  Physical Therapy Evaluation  Patient Details  Name: Rhonda [name] MRN: [id number] Date of Birth: Jun 27, 1950 Referring Provider: Redmond Baseman   Encounter Date: 02/14/2016      PT End of Session - 02/14/16 1333    Visit Number 1   Number of Visits 12   Date for PT Re-Evaluation 03/15/16   Authorization Type Medicare   Authorization - Visit Number 1   Authorization - Number of Visits 10   PT Start Time 1300   PT Stop Time 1337   PT Time Calculation (min) 37 min   Equipment Utilized During Treatment Gait belt   Activity Tolerance Patient tolerated treatment well   Behavior During Therapy [facility] for tasks assessed/performed      Past Medical History:  Diagnosis Date  . Hypertension   . Migraine   . Thyroid disease     Past Surgical History:  Procedure Laterality Date  . ABDOMINAL HYSTERECTOMY    . HERNIA REPAIR    . HIP SURGERY    . JOINT REPLACEMENT      There were no vitals filed for this visit.       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  She states that she feels like she is doing well.  She is still using her walker.     Pertinent History Rt THR    How long can you sit comfortably? 20 minutes and then she has to shift but can remain sitting    How long can you stand comfortably? Able to stand for 20 mintues    How long can you walk comfortably? walking with a walker for short distances only.    Patient Stated Goals to walk without an assistive device, decrease pain and be able to walk longer. To be able to don her clothes    Currently in Pain? Yes   Pain Score 4   with pain medication    Pain Location Hip   Pain Orientation Right   Pain Descriptors / Indicators Aching;Dull   Pain Onset 1 to 4 weeks ago   Pain Frequency Constant   Aggravating Factors  sitting    Pain Relieving Factors standing             [facility] PT Assessment - 02/14/16 0001      Assessment   Medical Diagnosis revised Rt THR   Referring Provider Redmond Baseman    Onset Date/Surgical Date 02/06/15   Next MD Visit 02/21/2016   Prior Therapy none     Precautions   Precautions Posterior Hip     Restrictions   Weight Bearing Restrictions No     Balance Screen   Has the patient fallen in the past 6 months No   Has the patient had a decrease in activity level because of a fear of falling?  No   Is the patient reluctant to leave their home because of a fear of falling?  No     Home Environment   Living Environment Private residence   Home Access Level entry   Home Layout Able to live on main level with bedroom/bathroom     Prior Function   Level of Independence Independent   Vocation Retired   Leisure jig saw puzzles      Cognition  Overall Cognitive Status Within Functional Limits for tasks assessed     Observation/Other Assessments-Edema    Edema --  noted increased swelling in Rt hip/thigh area      Functional Tests   Functional tests Single leg stance     Single Leg Stance   Comments LT: 60"; RT 0"     ROM / Strength   AROM / PROM / Strength AROM     AROM   AROM Assessment Site Hip   Right/Left Hip Right   Right Hip Flexion 60     Strength   Right Hip Flexion 2-/5   Right Hip Extension 3+/5   Right Hip ABduction 3-/5   Left Hip Flexion 5/5   Right Knee Flexion 4+/5   Right Knee Extension 5/5   Right Ankle Dorsiflexion 4/5   Left Ankle Dorsiflexion 5/5     Ambulation/Gait   Ambulation/Gait Yes                   [facility] Adult PT Treatment/Exercise - 02/14/16 0001      Ambulation/Gait   Ambulation Distance (Feet) 226 Feet   Assistive device Straight cane   Gait Pattern Step-through pattern   Gait Comments --     Exercises   Exercises Knee/Hip     Knee/Hip Exercises: Standing   Heel Raises 10  reps   Functional Squat 5 reps     Knee/Hip Exercises: Supine   Heel Slides Right;5 reps   Bridges 10 reps     Knee/Hip Exercises: Sidelying                    PT Education - 02/14/16 1332    Education Details HEp   Person(s) Educated Patient   Methods Explanation;Verbal cues;Handout   Comprehension Verbalized understanding;Returned demonstration          PT Short Term Goals - 02/14/16 1341      PT SHORT TERM GOAL #1   Title Pt to be ambulating in and outside with a cane   Time 2   Period Weeks   Status New     PT SHORT TERM GOAL #2   Title Pt pain to be no greater than a 2/10 to allow pt to walk for up to 20 minutes to allow pt to complete short shopping trips    Time 2   Period Weeks   Status New     PT SHORT TERM GOAL #3   Title Pt Rt hip ROM to be to 90 degrees to allow pt to be comfortable sitting for an hour to be able to eat a meal in comfort.    Time 3   Period Weeks   Status New           PT Long Term Goals - 02/14/16 1343      PT LONG TERM GOAL #1   Title Pt to be walking without an assistive device.    Time 6   Period Weeks     PT LONG TERM GOAL #2   Title Pt single leg stance to be at least 20 seconds to allow pt to feel confident walking on uneven terrain   Time 6   Period Weeks   Status New     PT LONG TERM GOAL #3   Title Pt strength of Rt LE to be at least a 4/5 to allow pt to feel confident ascending and descending a flight of steps in a reciprocal manner.  Time 6   Period Weeks   Status New     PT LONG TERM GOAL #4   Title Pt to be able to be up on her feet for two hours without right hip pain to complete light housework without a break.    Time 6   Period Weeks   Status New               Plan - 28-Feb-2016 1333    Clinical Impression Statement Ms. 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  Her initial THR failed.  She has currently been referred to skilled  physical therapy to increase her functional ability.  Examination demonstrated decreased hip strength, increased pain,decreased balance,  decreased ROM and difficulty walking.   Ms. Wardrop will benefit from skilled physical therapy to maximize her functional ability.    Rehab Potential Good   PT Frequency 2x / week   PT Duration 6 weeks   PT Treatment/Interventions Gait training;Stair training;Functional mobility training;Therapeutic activities;Therapeutic exercise;Balance training;Patient/family education;Manual lymph drainage   PT Next Visit Plan begin rocker board, single leg stance, sit to stand, step up both forward and lateral.  Progress pt in hip flexion and functional strengthening    PT Home Exercise Plan heelslides, bridging, heel raises and squats.     Consulted and Agree with Plan of Care Patient      Patient will benefit from skilled therapeutic intervention in order to improve the following deficits and impairments:  Decreased activity tolerance, Decreased balance, Decreased range of motion, Decreased strength, Difficulty walking, Pain  Visit Diagnosis: Stiffness of right hip, not elsewhere classified - Plan: PT plan of care cert/re-cert  Pain in right hip - Plan: PT plan of care cert/re-cert  Difficulty in walking, not elsewhere classified - Plan: PT plan of care cert/re-cert      G-Codes - 02-28-2016 1346    Functional Assessment Tool Used Pt subjective only walking in home from room to room with walker    Functional Limitation Mobility: Walking and moving around   Mobility: Walking and Moving Around Current Status (I3474) At least 60 percent but less than 80 percent impaired, limited or restricted   Mobility: Walking and Moving Around Goal Status (Q5956) At least 1 percent but less than 20 percent impaired, limited or restricted       Problem List Patient Active Problem List   Diagnosis Date Noted  . Stiffness of joint, not elsewhere classified, pelvic region and thigh  03/14/2011  . Abnormality of gait 03/14/2011  . Weakness of both legs 03/14/2011    Virgina Organ, PT CLT [phone] 02/28/16, 1:50 PM  [place] [facility] [address] [place], [place], [address] Phone: [phone]   Fax:  [phone]  Name: SAGRARIO GENSHEIMER MRN: [id number] Date of Birth: May 21, 1950

## 2016-02-14 NOTE — Patient Instructions (Addendum)
Heel Raise: Bilateral (Standing)   At kitchen counter Rise on balls of feet. Repeat __10__ times per set. Do __1__ sets per session. Do _2___ sessions per day. Copyright  VHI. All rights reserved.  Functional Quadriceps: Chair Squat    Keeping feet flat on floor, shoulder width apart, squat as low as is comfortable. Use support as necessary. Repeat __10__ times per set. Do _1___ sets per session. Do ___2_ sessions per day.  http://orth.exer.us/736   http://orth.exer.us/38   Copyright  VHI. All rights reserved.  Bridging    Slowly raise buttocks from floor, keeping stomach tight. Repeat _10___ times per set. Do ___1 sets per session. Do _2___ sessions per day.  http://orth.exer.us/1096    Copyright  VHI. All rights reserved.  Strengthening: Hip Abduction (Side-Lying)  Copyright  VHI. All rights reserved.  Self-Mobilization: Heel Slide (Supine)    Slide right heel toward buttocks until a gentle stretch is felt. Hold _3-5___ seconds. Relax. Repeat _10___ times per set. Do _1___ sets per session. Do ___3_ sessions per day.  http://orth.exer.us/710   Copyright  VHI. All rights reserved.

## 2016-02-16 ENCOUNTER — Telehealth (HOSPITAL_COMMUNITY): Payer: Self-pay

## 2016-02-16 ENCOUNTER — Ambulatory Visit (HOSPITAL_COMMUNITY): Payer: Medicare Other | Admitting: Physical Therapy

## 2016-02-16 NOTE — Telephone Encounter (Signed)
02/16/16  Pt cx 12-7 and also 12-13 but on that day she will be going to dr since surgery and has been added to wait list for next week

## 2016-02-21 ENCOUNTER — Telehealth (HOSPITAL_COMMUNITY): Payer: Self-pay

## 2016-02-21 ENCOUNTER — Encounter (HOSPITAL_COMMUNITY): Payer: Medicare Other

## 2016-02-21 NOTE — Telephone Encounter (Signed)
02/21/16 pt was on wait list but the time I offered she couldn't do

## 2016-02-22 ENCOUNTER — Ambulatory Visit (HOSPITAL_COMMUNITY): Payer: Medicare Other

## 2016-02-27 ENCOUNTER — Ambulatory Visit (HOSPITAL_COMMUNITY): Payer: Medicare Other | Admitting: Physical Therapy

## 2016-02-27 DIAGNOSIS — M25551 Pain in right hip: Secondary | ICD-10-CM

## 2016-02-27 DIAGNOSIS — M25651 Stiffness of right hip, not elsewhere classified: Secondary | ICD-10-CM

## 2016-02-27 DIAGNOSIS — R262 Difficulty in walking, not elsewhere classified: Secondary | ICD-10-CM

## 2016-02-27 NOTE — Therapy (Signed)
[place] [facility] [address] [place], [place], [address] Phone: [phone]   Fax:  [phone]  Physical Therapy Treatment  Patient Details  Name: Rhonda [name] MRN: [id number] Date of Birth: [birth year] Referring Provider: Redmond Baseman   Encounter Date: 02/27/2016      PT End of Session - 02/27/16 0950    Visit Number 2   Number of Visits 12   Date for PT Re-Evaluation 03/15/16   Authorization Type Medicare   Authorization - Visit Number 2   Authorization - Number of Visits 10   PT Start Time 0904   PT Stop Time 0942   PT Time Calculation (min) 38 min   Equipment Utilized During Treatment Gait belt   Activity Tolerance Patient tolerated treatment well;No increased pain   Behavior During Therapy WFL for tasks assessed/performed      Past Medical History:  Diagnosis Date  . Hypertension   . Migraine   . Thyroid disease     Past Surgical History:  Procedure Laterality Date  . ABDOMINAL HYSTERECTOMY    . HERNIA REPAIR    . HIP SURGERY    . JOINT REPLACEMENT      There were no vitals filed for this visit.      Subjective Assessment - 02/27/16 0906    Subjective Pt states that she has been sick the past couple of days and was not able to perform her exercises. She has no pain currently.    Pertinent History Rt THR    How long can you sit comfortably? 20 minutes and then she has to shift but can remain sitting    How long can you stand comfortably? Able to stand for 20 mintues    How long can you walk comfortably? walking with a walker for short distances only.    Patient Stated Goals to walk without an assistive device, decrease pain and be able to walk longer. To be able to don her clothes    Currently in Pain? No/denies   Pain Onset 1 to 4 weeks ago                         [facility] Adult PT Treatment/Exercise - 02/27/16 0001      Knee/Hip Exercises: Seated   Ball Squeeze 2x10, 5 sec hold    Clamshell  with TheraBand Red  decreased activation noted on RLE. 2x10 reps      Knee/Hip Exercises: Supine   Heel Slides Right;2 sets;10 reps   Bridges 2 sets;10 reps;Both   Other Supine Knee/Hip Exercises Rt bent knee raise x10 reps with modA      Manual Therapy   Manual Therapy Soft tissue mobilization   Manual therapy comments separate rest of session   Soft tissue mobilization STM Along surgical incision avoiding scab areas and educating pt on the importance of massage, scar mobility       Supine RLE straight leg ER x10 reps           PT Education - 02/27/16 0950    Education provided Yes   Education Details importance of starting back with her HEP; scar massage/mobility   Person(s) Educated Patient   Methods Explanation;Demonstration   Comprehension Verbalized understanding          PT Short Term Goals - 02/14/16 1341      PT SHORT TERM GOAL #1   Title Pt to be ambulating in and outside with a cane  Time 2   Period Weeks   Status New     PT SHORT TERM GOAL #2   Title Pt pain to be no greater than a 2/10 to allow pt to walk for up to 20 minutes to allow pt to complete short shopping trips    Time 2   Period Weeks   Status New     PT SHORT TERM GOAL #3   Title Pt Rt hip ROM to be to 90 degrees to allow pt to be comfortable sitting for an hour to be able to eat a meal in comfort.    Time 3   Period Weeks   Status New           PT Long Term Goals - 02/14/16 1343      PT LONG TERM GOAL #1   Title Pt to be walking without an assistive device.    Time 6   Period Weeks     PT LONG TERM GOAL #2   Title Pt single leg stance to be at least 20 seconds to allow pt to feel confident walking on uneven terrain   Time 6   Period Weeks   Status New     PT LONG TERM GOAL #3   Title Pt strength of Rt LE to be at least a 4/5 to allow pt to feel confident ascending and descending a flight of steps in a reciprocal manner.     Time 6   Period Weeks   Status New      PT LONG TERM GOAL #4   Title Pt to be able to be up on her feet for two hours without right hip pain to complete light housework without a break.    Time 6   Period Weeks   Status New               Plan - 02/27/16 1213    Clinical Impression Statement Pt arrived today having spent minimal time working on her HEP secondary to reported sickness. Session spent reviewing several exercises from her HEP and introducing scar massage along healed areas of her surgical incision. Noting pt presents with Rt hip IR posturing in all positions secondary to weak external rotators, also noted during clamshell activity. Added 1 exercise to address this at home with pt demonstrating understanding. Will continue with current POC.   Rehab Potential Good   PT Frequency 2x / week   PT Duration 6 weeks   PT Treatment/Interventions Gait training;Stair training;Functional mobility training;Therapeutic activities;Therapeutic exercise;Balance training;Patient/family education;Manual lymph drainage   PT Next Visit Plan hip ER/abduction strengthening, scar massage/mobility; begin rocker board, single leg stance, sit to stand   PT Home Exercise Plan heelslides, bridging, heel raises and squats, straight leg hip ER in supine    Consulted and Agree with Plan of Care Patient      Patient will benefit from skilled therapeutic intervention in order to improve the following deficits and impairments:  Decreased activity tolerance, Decreased balance, Decreased range of motion, Decreased strength, Difficulty walking, Pain  Visit Diagnosis: Stiffness of right hip, not elsewhere classified  Pain in right hip  Difficulty in walking, not elsewhere classified     Problem List Patient Active Problem List   Diagnosis Date Noted  . Stiffness of joint, not elsewhere classified, pelvic region and thigh 03/14/2011  . Abnormality of gait 03/14/2011  . Weakness of both legs 03/14/2011    12:25 PM,02/27/16 Marylyn Ishihara PT,  DPT [facility] [phone]  [facility] [address] [place], [place], [address] Phone: [phone]   Fax:  [phone]  Name: Rhonda [name] MRN: [id number] Date of Birth: 1950/06/20

## 2016-02-29 ENCOUNTER — Ambulatory Visit (HOSPITAL_COMMUNITY): Payer: Medicare Other

## 2016-03-08 ENCOUNTER — Ambulatory Visit (HOSPITAL_COMMUNITY): Payer: Medicare Other

## 2016-03-08 DIAGNOSIS — M25651 Stiffness of right hip, not elsewhere classified: Secondary | ICD-10-CM

## 2016-03-08 DIAGNOSIS — R262 Difficulty in walking, not elsewhere classified: Secondary | ICD-10-CM

## 2016-03-08 DIAGNOSIS — M25551 Pain in right hip: Secondary | ICD-10-CM

## 2016-03-08 NOTE — Therapy (Signed)
[place] [facility] [address] [place], [place], [address] Phone: [phone]   Fax:  [phone]  Physical Therapy Treatment  Patient Details  Name: Rhonda [name] MRN: [id number] Date of Birth: [birth year] Referring Provider: Redmond Baseman   Encounter Date: 03/08/2016      PT End of Session - 03/08/16 1529    Visit Number 3   Number of Visits 12   Date for PT Re-Evaluation 03/15/16   Authorization Type Medicare   Authorization - Visit Number 3   Authorization - Number of Visits 10   PT Start Time 1520   PT Stop Time 1601   PT Time Calculation (min) 41 min   Equipment Utilized During Treatment Gait belt   Activity Tolerance Patient tolerated treatment well;Patient limited by pain;Patient limited by fatigue   Behavior During Therapy [facility] for tasks assessed/performed      Past Medical History:  Diagnosis Date  . Hypertension   . Migraine   . Thyroid disease     Past Surgical History:  Procedure Laterality Date  . ABDOMINAL HYSTERECTOMY    . HERNIA REPAIR    . HIP SURGERY    . JOINT REPLACEMENT      There were no vitals filed for this visit.      Subjective Assessment - 03/08/16 1524    Subjective Pt stated she went to [facility] prior session today and feels she may have overdone it.  Reports compliance with HEP every other day.  Current pain scale 5/10 Rt hip achey and sore.     Pertinent History Rt THR    Patient Stated Goals to walk without an assistive device, decrease pain and be able to walk longer. To be able to don her clothes    Currently in Pain? Yes   Pain Score 5    Pain Location Hip   Pain Orientation Right   Pain Descriptors / Indicators Aching;Sore   Pain Type Surgical pain   Pain Onset 1 to 4 weeks ago   Pain Frequency Constant   Aggravating Factors  sitting   Pain Relieving Factors standing   Effect of Pain on Daily Activities increases                         [facility] Adult PT  Treatment/Exercise - 03/08/16 0001      Knee/Hip Exercises: Standing   Heel Raises 15 reps   Heel Raises Limitations     Functional Squat 10 reps   Rocker Board 2 minutes   Rocker Board Limitations R/L; A/P   SLS Lt 27", Rt 38"     Knee/Hip Exercises: Seated   Sit to Sand 10 reps;without UE support  Cueing to reduce IR, added RTB around knees     Knee/Hip Exercises: Supine   Heel Slides Right;10 reps  cueing to reduce IR   Other Supine Knee/Hip Exercises Rt bent knee raise x10 reps with modA                   PT Short Term Goals - 02/14/16 1341      PT SHORT TERM GOAL #1   Title Pt to be ambulating in and outside with a cane   Time 2   Period Weeks   Status New     PT SHORT TERM GOAL #2   Title Pt pain to be no greater than a 2/10 to allow pt to walk for up to 20 minutes  to allow pt to complete short shopping trips    Time 2   Period Weeks   Status New     PT SHORT TERM GOAL #3   Title Pt Rt hip ROM to be to 90 degrees to allow pt to be comfortable sitting for an hour to be able to eat a meal in comfort.    Time 3   Period Weeks   Status New           PT Long Term Goals - 02/14/16 1343      PT LONG TERM GOAL #1   Title Pt to be walking without an assistive device.    Time 6   Period Weeks     PT LONG TERM GOAL #2   Title Pt single leg stance to be at least 20 seconds to allow pt to feel confident walking on uneven terrain   Time 6   Period Weeks   Status New     PT LONG TERM GOAL #3   Title Pt strength of Rt LE to be at least a 4/5 to allow pt to feel confident ascending and descending a flight of steps in a reciprocal manner.     Time 6   Period Weeks   Status New     PT LONG TERM GOAL #4   Title Pt to be able to be up on her feet for two hours without right hip pain to complete light housework without a break.    Time 6   Period Weeks   Status New               Plan - 03/08/16 1606    Clinical Impression Statement Pt arrived  stating she may have over done walking in [employer] prior session, pain scale Rt hip 5/10 at entrance.  Session foucs on functional strengthening with addition of closed chain exercises and reviewing form with HEP.  Began rocker board to improve weight distribution with gait. as well as sit to stand.  Noted tendency for increased Rt hip IR posturing in all positoins secondary to weak external rotattors.  Reviewed posterior hip precautions and educated pt to improve awarenss with hip alignment especially with sit to stands and supine heel slide HEP.  Pt did report increase muscle soreness at EOS.  Reviewed technique with scar tissue massage and RICE technqiues for pain control.   Rehab Potential Good   PT Frequency 2x / week   PT Duration 6 weeks   PT Treatment/Interventions Gait training;Stair training;Functional mobility training;Therapeutic activities;Therapeutic exercise;Balance training;Patient/family education;Manual lymph drainage   PT Next Visit Plan hip ER/abduction strengthening, scar massage/mobility;.  Continue wiht current PT POC with addition of ER strengthening next session.     PT Home Exercise Plan heelslides, bridging, heel raises and squats, straight leg hip ER in supine       Patient will benefit from skilled therapeutic intervention in order to improve the following deficits and impairments:  Decreased activity tolerance, Decreased balance, Decreased range of motion, Decreased strength, Difficulty walking, Pain  Visit Diagnosis: Stiffness of right hip, not elsewhere classified  Pain in right hip  Difficulty in walking, not elsewhere classified     Problem List Patient Active Problem List   Diagnosis Date Noted  . Stiffness of joint, not elsewhere classified, pelvic region and thigh 03/14/2011  . Abnormality of gait 03/14/2011  . Weakness of both legs 03/14/2011   Becky Sax, LPTA; CBIS [phone]  Juel Burrow 03/08/2016, 4:15 PM  [facility] [address] [place], [place], [address] Phone: [phone]   Fax:  [phone]  Name: Rhonda [name] MRN: [id number] Date of Birth: [birth year]

## 2016-03-14 ENCOUNTER — Ambulatory Visit (HOSPITAL_COMMUNITY): Payer: Medicare Other | Attending: Physician Assistant | Admitting: Physical Therapy

## 2016-03-14 DIAGNOSIS — R262 Difficulty in walking, not elsewhere classified: Secondary | ICD-10-CM

## 2016-03-14 DIAGNOSIS — M25651 Stiffness of right hip, not elsewhere classified: Secondary | ICD-10-CM

## 2016-03-14 DIAGNOSIS — M25551 Pain in right hip: Secondary | ICD-10-CM | POA: Diagnosis present

## 2016-03-14 NOTE — Therapy (Addendum)
[place], [place], [address] Phone: [phone]   Fax:  [phone]  Physical Therapy Treatment/Discharge   Patient Details  Name: Rhonda [name] MRN: [id number] Date of Birth: 1950/09/23 Referring Provider: Kerry Dory   Encounter Date: 03/14/2016      PT End of Session - 03/14/16 1115    Visit Number 4   Number of Visits 12   Date for PT Re-Evaluation 03/15/16   Authorization Type Medicare   Authorization - Visit Number 4   Authorization - Number of Visits 10   PT Start Time 6962   PT Stop Time 1105   PT Time Calculation (min) 29 min   Equipment Utilized During Treatment Gait belt   Activity Tolerance Patient tolerated treatment well;Patient limited by pain;Patient limited by fatigue   Behavior During Therapy [facility] for tasks assessed/performed      Past Medical History:  Diagnosis Date  . Hypertension   . Migraine   . Thyroid disease     Past Surgical History:  Procedure Laterality Date  . ABDOMINAL HYSTERECTOMY    . HERNIA REPAIR    . HIP SURGERY    . JOINT REPLACEMENT      There were no vitals filed for this visit.      Subjective Assessment - 03/14/16 1036    Subjective Pt reports no pain, only stiffness and attributes to cold weather currently.     Currently in Pain? No/denies                         [facility] Adult PT Treatment/Exercise - 03/14/16 0001      Knee/Hip Exercises: Standing   Heel Raises 15 reps   Heel Raises Limitations toeraises 15 reps   Forward Lunges Both;15 reps   Forward Lunges Limitations onto 4" step no UE   Hip Abduction Both;15 reps   Hip Extension Both;15 reps   Lateral Step Up Both;10 reps;Step Height: 4";Hand Hold: 0   Lateral Step Up Limitations 4"   Forward Step Up Both;10 reps;Step Height: 4";Hand Hold: 0   Forward Step Up Limitations 4"   Functional Squat 15 reps   Stairs 4" reciprocally no UE's 2RT   SLS 1 minute bilatearlly no UE's                   PT Short Term Goals - 02/14/16 1341      PT SHORT TERM GOAL #1   Title Pt to be ambulating in and outside with a cane   Time 2   Period Weeks   Status New     PT SHORT TERM GOAL #2   Title Pt pain to be no greater than a 2/10 to allow pt to walk for up to 20 minutes to allow pt to complete short shopping trips    Time 2   Period Weeks   Status New     PT SHORT TERM GOAL #3   Title Pt Rt hip ROM to be to 90 degrees to allow pt to be comfortable sitting for an hour to be able to eat a meal in comfort.    Time 3   Period Weeks   Status New           PT Long Term Goals - 02/14/16 1343      PT LONG TERM GOAL #1   Title Pt to be walking without an assistive device.    Time  6   Period Weeks     PT LONG TERM GOAL #2   Title Pt single leg stance to be at least 20 seconds to allow pt to feel confident walking on uneven terrain   Time 6   Period Weeks   Status New     PT LONG TERM GOAL #3   Title Pt strength of Rt LE to be at least a 4/5 to allow pt to feel confident ascending and descending a flight of steps in a reciprocal manner.     Time 6   Period Weeks   Status New     PT LONG TERM GOAL #4   Title Pt to be able to be up on her feet for two hours without right hip pain to complete light housework without a break.    Time 6   Period Weeks   Status New               Plan - 03/14/16 1115    Clinical Impression Statement PT requested to leave early this session (no known cause).  Pt is overall doing fantastic.  Able to advance all exercises this session in standing.  Completed without UE assist.  Able to SLS on each LE for 1 minute without LOB or use of UE's. Also able to complete 4" stairs reciprocally without use of HR.   Pt having most difficulty advancing Rt LE up, for example,  stepping up onto a step due to groin discomfort.  Encouraged patient to work on this at home.     Rehab Potential Good   PT Frequency 2x / week   PT  Duration 6 weeks   PT Treatment/Interventions Gait training;Stair training;Functional mobility training;Therapeutic activities;Therapeutic exercise;Balance training;Patient/family education;Manual lymph drainage   PT Next Visit Plan hip ER/abduction strengthening, scar massage/mobility;.  Continue wiht current PT POC with addition of ER strengthening next session.     PT Home Exercise Plan heelslides, bridging, heel raises and squats, straight leg hip ER in supine       Patient will benefit from skilled therapeutic intervention in order to improve the following deficits and impairments:  Decreased activity tolerance, Decreased balance, Decreased range of motion, Decreased strength, Difficulty walking, Pain  Visit Diagnosis: Stiffness of right hip, not elsewhere classified  Pain in right hip  Difficulty in walking, not elsewhere classified     Problem List Patient Active Problem List   Diagnosis Date Noted  . Stiffness of joint, not elsewhere classified, pelvic region and thigh 03/14/2011  . Abnormality of gait 03/14/2011  . Weakness of both legs 03/14/2011    Teena Irani, PTA/CLT [phone]  03/14/2016, 11:19 AM  [place] [address] [place], [place], [address] Phone: [phone]   Fax:  [phone]  Name: Rhonda [name] MRN: [id number] Date of Birth: September 30, 1950    * addendum to d/c pt and close episode of care.  PHYSICAL THERAPY DISCHARGE SUMMARY  Visits from Start of Care: 4  Current functional level related to goals / functional outcomes: See above for more details    Remaining deficits: See above for more details    Education / Equipment: See above for more details   Plan: Patient agrees to discharge.  Patient goals were not met. Patient is being discharged due to not returning since the last visit.  ?????    10:32 AM,08/23/16 [place] [phone]

## 2016-03-19 ENCOUNTER — Ambulatory Visit (HOSPITAL_COMMUNITY): Payer: Medicare Other | Admitting: Physical Therapy

## 2016-03-19 ENCOUNTER — Telehealth (HOSPITAL_COMMUNITY): Payer: Self-pay | Admitting: Physical Therapy

## 2016-03-19 NOTE — Telephone Encounter (Signed)
Called re: missed appointment.  Pt has company in from out of town and completely forgot.  She will be at her next visit.  Virgina Organ, PT CLT [phone]

## 2016-03-21 ENCOUNTER — Telehealth (HOSPITAL_COMMUNITY): Payer: Self-pay | Admitting: Physical Therapy

## 2016-03-21 ENCOUNTER — Ambulatory Visit (HOSPITAL_COMMUNITY): Payer: Medicare Other | Admitting: Physical Therapy

## 2016-03-21 ENCOUNTER — Other Ambulatory Visit (HOSPITAL_COMMUNITY): Payer: Self-pay | Admitting: Internal Medicine

## 2016-03-21 DIAGNOSIS — Z1231 Encounter for screening mammogram for malignant neoplasm of breast: Secondary | ICD-10-CM

## 2016-03-21 NOTE — Telephone Encounter (Signed)
no transportation

## 2016-03-23 ENCOUNTER — Ambulatory Visit (HOSPITAL_COMMUNITY)
Admission: RE | Admit: 2016-03-23 | Discharge: 2016-03-23 | Disposition: A | Discharge status: Home / Self Care | Payer: Medicare Other | Source: Ambulatory Visit | Attending: Internal Medicine | Admitting: Internal Medicine

## 2016-03-23 DIAGNOSIS — Z1231 Encounter for screening mammogram for malignant neoplasm of breast: Secondary | ICD-10-CM | POA: Diagnosis not present

## 2017-04-02 ENCOUNTER — Other Ambulatory Visit (HOSPITAL_COMMUNITY): Payer: Self-pay | Admitting: Internal Medicine

## 2017-04-02 DIAGNOSIS — Z1231 Encounter for screening mammogram for malignant neoplasm of breast: Secondary | ICD-10-CM

## 2017-04-04 ENCOUNTER — Ambulatory Visit (HOSPITAL_COMMUNITY): Payer: Medicare Other

## 2017-04-11 ENCOUNTER — Ambulatory Visit (HOSPITAL_COMMUNITY)
Admission: RE | Admit: 2017-04-11 | Discharge: 2017-04-11 | Disposition: A | Discharge status: Home / Self Care | Payer: Medicare Other | Source: Ambulatory Visit | Attending: Internal Medicine | Admitting: Internal Medicine

## 2017-04-11 DIAGNOSIS — Z1231 Encounter for screening mammogram for malignant neoplasm of breast: Secondary | ICD-10-CM | POA: Diagnosis present

## 2017-04-29 ENCOUNTER — Encounter (HOSPITAL_COMMUNITY): Payer: Self-pay | Admitting: Emergency Medicine

## 2017-04-29 ENCOUNTER — Emergency Department (HOSPITAL_COMMUNITY)
Admission: EM | Admit: 2017-04-29 | Discharge: 2017-04-29 | Disposition: A | Discharge status: Home / Self Care | Payer: Medicare Other | Attending: Emergency Medicine | Admitting: Emergency Medicine

## 2017-04-29 ENCOUNTER — Emergency Department (HOSPITAL_COMMUNITY): Payer: Medicare Other

## 2017-04-29 ENCOUNTER — Other Ambulatory Visit: Payer: Self-pay

## 2017-04-29 DIAGNOSIS — M67431 Ganglion, right wrist: Secondary | ICD-10-CM | POA: Insufficient documentation

## 2017-04-29 DIAGNOSIS — G8929 Other chronic pain: Secondary | ICD-10-CM | POA: Diagnosis not present

## 2017-04-29 DIAGNOSIS — R600 Localized edema: Secondary | ICD-10-CM | POA: Insufficient documentation

## 2017-04-29 DIAGNOSIS — M79641 Pain in right hand: Secondary | ICD-10-CM | POA: Insufficient documentation

## 2017-04-29 DIAGNOSIS — I10 Essential (primary) hypertension: Secondary | ICD-10-CM | POA: Diagnosis not present

## 2017-04-29 DIAGNOSIS — Z79899 Other long term (current) drug therapy: Secondary | ICD-10-CM | POA: Insufficient documentation

## 2017-04-29 DIAGNOSIS — Z87891 Personal history of nicotine dependence: Secondary | ICD-10-CM | POA: Insufficient documentation

## 2017-04-29 LAB — CBC WITH DIFFERENTIAL/PLATELET
BASOS PCT: 0 %
Basophils Absolute: 0 10*3/uL (ref 0.0–0.1)
EOS ABS: 0.1 10*3/uL (ref 0.0–0.7)
Eosinophils Relative: 2 %
HCT: 38.5 % (ref 36.0–46.0)
HEMOGLOBIN: 12.6 g/dL (ref 12.0–15.0)
Lymphocytes Relative: 28 %
Lymphs Abs: 1.8 10*3/uL (ref 0.7–4.0)
MCH: 30.6 pg (ref 26.0–34.0)
MCHC: 32.7 g/dL (ref 30.0–36.0)
MCV: 93.4 fL (ref 78.0–100.0)
MONOS PCT: 12 %
Monocytes Absolute: 0.7 10*3/uL (ref 0.1–1.0)
NEUTROS PCT: 58 %
Neutro Abs: 3.7 10*3/uL (ref 1.7–7.7)
Platelets: 232 10*3/uL (ref 150–400)
RBC: 4.12 MIL/uL (ref 3.87–5.11)
RDW: 13 % (ref 11.5–15.5)
WBC: 6.4 10*3/uL (ref 4.0–10.5)

## 2017-04-29 LAB — URIC ACID: URIC ACID, SERUM: 5.2 mg/dL (ref 2.3–6.6)

## 2017-04-29 MED ORDER — DICLOFENAC SODIUM 75 MG PO TBEC: 75.0000 mg | DELAYED_RELEASE_TABLET | Freq: Two times a day (BID) | ORAL | 0 refills | Status: DC

## 2017-04-29 NOTE — Discharge Instructions (Signed)
Your labs and xrays are normal today. Use the medicine prescribed along with elevation and gentle compression (like a loose ace wrap) which may help with pain and swelling.

## 2017-04-29 NOTE — ED Provider Notes (Signed)
Medical screening examination/treatment/procedure(s) were conducted as a shared visit with non-physician practitioner(s) and myself.  I personally evaluated the patient during the encounter.  Swelling and mild tenderness to left dorsal hand.  No evidence of erythema, warmth or significant tenderness to be concern for infection.  Likely inflammatory in nature.  Low suspicion for DVT.  Will treat for inflammation with close PCP follow-up.   Marily Memos, MD 04/29/17 1606

## 2017-04-29 NOTE — ED Provider Notes (Signed)
[facility] EMERGENCY DEPARTMENT Provider Note   CSN: [id number] Arrival date & time: 04/29/17  0756     History   Chief Complaint Chief Complaint  Patient presents with  . Hand Pain    HPI Rhonda [name] is a 67 y.o. female with a history of hypertension, migraine, thyroid disease and a chronic right volar wrist ganglion cyst presenting with right dorsal hand pain and swelling which developed gradually yesterday.  She describes an aching pain which is worsened with palpation in movement of her fingers and wrist and is better at rest.  She had mild edema across her distal hand yesterday which worsened, now involving the entire dorsal hand since waking today.  She denies injuries, fevers, chills.  No personal or family history of gout.  She takes tramadol 4 times daily for chronic hip pain, last dose taken yesterday evening and it did seem to help with her hand pain.  The history is provided by the patient.    Past Medical History:  Diagnosis Date  . Hypertension   . Migraine   . Thyroid disease     Patient Active Problem List   Diagnosis Date Noted  . Stiffness of joint, not elsewhere classified, pelvic region and thigh 03/14/2011  . Abnormality of gait 03/14/2011  . Weakness of both legs 03/14/2011    Past Surgical History:  Procedure Laterality Date  . ABDOMINAL HYSTERECTOMY    . HERNIA REPAIR    . HIP SURGERY    . JOINT REPLACEMENT      OB History    Gravida Para Term Preterm AB Living   5 5 5     5    SAB TAB Ectopic Multiple Live Births                   Home Medications    Prior to Admission medications   Medication Sig Start Date End Date Taking? Authorizing Provider  aspirin-acetaminophen-caffeine (EXCEDRIN EXTRA STRENGTH) 8035850092 MG per tablet Take 2 tablets by mouth every 6 (six) hours as needed for headache.   Yes [provider]  Homeopathic Products (ZICAM ALLERGY RELIEF NA) Place 1 spray into the nose daily as needed.   Yes [provider]  levothyroxine (SYNTHROID, LEVOTHROID) 25 MCG tablet Take 25 mcg by mouth daily before breakfast.  10/01/15  Yes [provider]  metoprolol succinate (TOPROL-XL) 25 MG 24 hr tablet Take 25 mg by mouth daily as needed (for blood pressure).  10/21/12  Yes [provider]  traMADol (ULTRAM) 50 MG tablet Take 50-100 mg by mouth 2 (two) times daily as needed. For pain   Yes [provider]  diclofenac (VOLTAREN) 75 MG EC tablet Take 1 tablet (75 mg total) by mouth 2 (two) times daily. 04/29/17   05/01/17, PA-C    Family History Family History  Problem Relation Age of Onset  . Mental illness Mother   . Mental illness Brother   . Mental illness Other     Social History Social History   Tobacco Use  . Smoking status: Former Smoker    Last attempt to quit: 04/29/1993    Years since quitting: 24.0  . Smokeless tobacco: Never Used  Substance Use Topics  . Alcohol use: Yes    Comment: occ  . Drug use: No     Allergies   Patient has no known allergies.   Review of Systems Review of Systems  Constitutional: Negative for fever.  Musculoskeletal: Positive for arthralgias  and joint swelling. Negative for myalgias.  Skin: Negative for color change and wound.  Neurological: Negative for weakness and numbness.     Physical Exam Updated Vital Signs BP 128/84   Pulse 77   Temp 98.2 F (36.8 C) (Oral)   Resp 16   Ht 5\' 4"  (1.626 m)   Wt 74.8 kg (165 lb)   SpO2 99%   BMI 28.32 kg/m   Physical Exam  Constitutional: She appears well-developed and well-nourished.  HENT:  Head: Atraumatic.  Neck: Normal range of motion.  Cardiovascular:  Pulses equal bilaterally  Musculoskeletal: She exhibits edema and tenderness.  Mildly tender mobile nodule right volar wrist.  Patient is tender to palpation with edema and increased warmth of her right dorsal hand to her wrist.  Her skin is intact, there is no erythema or red streaking.  No induration or  fluctuance.  She has less than 2-second cap refill in all of her fingertips.  Pain is worsened with resisted finger flexion.  Neurological: She is alert. She has normal strength. She displays normal reflexes. No sensory deficit.  Skin: Skin is warm and dry.  Psychiatric: She has a normal mood and affect.     ED Treatments / Results  Labs (all labs ordered are listed, but only abnormal results are displayed) Labs Reviewed  CBC WITH DIFFERENTIAL/PLATELET  URIC ACID    EKG  EKG Interpretation None       Radiology Dg Hand Complete Right  Result Date: 04/29/2017 CLINICAL DATA:  Right hand pain and swelling for the past week. No injury. EXAM: RIGHT HAND - COMPLETE 3+ VIEW COMPARISON:  None. FINDINGS: There is no evidence of fracture or dislocation. There is no evidence of arthropathy or other focal bone abnormality. Soft tissues are unremarkable. IMPRESSION: Negative. Electronically Signed   By: 05/01/2017 M.D.   On: 04/29/2017 09:38    Procedures Procedures (including critical care time)  Medications Ordered in ED Medications - No data to display   Initial Impression / Assessment and Plan / ED Course  I have reviewed the triage vital signs and the nursing notes.  Pertinent labs & imaging results that were available during my care of the patient were reviewed by me and considered in my medical decision making (see chart for details).     Labs and imaging reviewed and discussed with patient.  Her exam and history is suggesting an inflammatory process, doubt cellulitis.  Also doubt DVT, as patient has no risk factors and has no forearm or upper arm pain or swelling.  She was placed on diclofenac, recommended elevation, warm compresses, Ace wrap if this improves symptoms but to apply loosely.  Referral to Dr. 05/01/2017 for further evaluation.  She was also advised to return here if she has continued swelling or pain, as if the symptoms persist she may need ultrasound imaging  and/or antibiotic therapy, although at this time there is no suggestion of this being a cellulitis or DVT.  Patient was seen by Dr. Romeo Apple prior to discharge home.  Final Clinical Impressions(s) / ED Diagnoses   Final diagnoses:  Right hand pain    ED Discharge Orders        Ordered    diclofenac (VOLTAREN) 75 MG EC tablet  2 times daily     04/29/17 1027       05/01/17, Burgess Amor 04/29/17 1033    Mesner, 05/01/17, MD 04/29/17 1615

## 2017-04-29 NOTE — ED Triage Notes (Signed)
Pt states was told a month ago she has a ganglian cysty on right inner wrist without pain or difficulty using it. Over past week as had swelling to hand intermittently. Denies injury.

## 2017-05-27 ENCOUNTER — Encounter: Payer: Self-pay | Admitting: Orthopedic Surgery

## 2017-05-27 ENCOUNTER — Ambulatory Visit (INDEPENDENT_AMBULATORY_CARE_PROVIDER_SITE_OTHER): Payer: Medicare Other | Admitting: Orthopedic Surgery

## 2017-05-27 VITALS — BP 150/90 | HR 84 | Ht 64.0 in | Wt 167.0 lb

## 2017-05-27 DIAGNOSIS — M67331 Transient synovitis, right wrist: Secondary | ICD-10-CM

## 2017-05-27 MED ORDER — DICLOFENAC SODIUM 75 MG PO TBEC: 75.0000 mg | DELAYED_RELEASE_TABLET | Freq: Two times a day (BID) | ORAL | 2 refills | Status: DC

## 2017-05-27 NOTE — Patient Instructions (Signed)
Take 75 mg Voltaren twice a day wear wrist brace full-time may remove for bathing and sleeping

## 2017-05-27 NOTE — Progress Notes (Signed)
NEW PATIENT OFFICE VISIT   Chief Complaint  Patient presents with  . Wrist Pain    woke up with pain right ulnar styloid area about a month ago     67 years old female presents for evaluation of acute onset of pain right wrist  She is 67 she has had bilateral hip replacements including a right hip revision for osteoarthritis presents with acute onset of pain dorsal and radial ulnar aspect right wrist with painful range of motion no history of trauma.  She did go to the ER x-rays were normal  She basically has acute moderate pain over the DRUJ dorsum of the wrist and the third and fourth compartments for 1 month no improvement with ibuprofen 2 pills twice a day.  She did have some GI upset but does not want to take steroids.    Review of Systems  Skin: Negative.   Neurological: Negative for tingling.     Past Medical History:  Diagnosis Date  . Hypertension   . Migraine   . Thyroid disease     Past Surgical History:  Procedure Laterality Date  . ABDOMINAL HYSTERECTOMY    . HERNIA REPAIR    . HIP SURGERY    . JOINT REPLACEMENT      Family History  Problem Relation Age of Onset  . Mental illness Mother   . Mental illness Brother   . Mental illness Other    Social History   Tobacco Use  . Smoking status: Former Smoker    Last attempt to quit: 04/29/1993    Years since quitting: 24.0  . Smokeless tobacco: Never Used  Substance Use Topics  . Alcohol use: Yes    Comment: occ  . Drug use: No    Current Meds  Medication Sig  . aspirin-acetaminophen-caffeine (EXCEDRIN EXTRA STRENGTH) 250-250-65 MG per tablet Take 2 tablets by mouth every 6 (six) hours as needed for headache.  . Homeopathic Products (ZICAM ALLERGY RELIEF NA) Place 1 spray into the nose daily as needed.  Marland Kitchen ibuprofen (ADVIL,MOTRIN) 200 MG tablet Take 200 mg by mouth every 6 (six) hours as needed.  Marland Kitchen levothyroxine (SYNTHROID, LEVOTHROID) 25 MCG tablet Take 25 mcg by mouth daily before breakfast.   .  loratadine (CLARITIN) 10 MG tablet Take 10 mg by mouth daily.  . metoprolol succinate (TOPROL-XL) 25 MG 24 hr tablet Take 25 mg by mouth daily as needed (for blood pressure).   . traMADol (ULTRAM) 50 MG tablet Take 50-100 mg by mouth 2 (two) times daily as needed. For pain    BP (!) 150/90   Pulse 84   Ht 5\' 4"  (1.626 m)   Wt 167 lb (75.8 kg)   BMI 28.67 kg/m   Physical Exam  Constitutional: She is oriented to person, place, and time. She appears well-developed and well-nourished.  Neurological: She is alert and oriented to person, place, and time.  Psychiatric: She has a normal mood and affect. Judgment normal.  Vitals reviewed.   Ortho Exam  Right wrist exam tenderness over the DRUJ dorsum of the wrist over the third and fourth compartments painful passive and active range of motion the range of motion is full no instability  There is a mass on the volar aspect of the wrist from a chronic volar ganglion.  Grip strength remains normal there is no rash noted pulse and perfusion of the hand is normal sensation is normal there is no lymphadenopathy   MEDICAL DECISION SECTION  xrays ordered? no  My independent reading of xrays: X-ray was done at the hospital 3 views of the hand no fracture dislocation or soft tissue swelling   Encounter Diagnosis  Name Primary?  . Transient synovitis of right wrist Yes     PLAN:   Follow-up in 4 weeks if no improvement I told her we will send her to a hand specialist for further evaluation.  No orders of the defined types were placed in this encounter.  Injection? no MRI/CT/? no

## 2017-06-11 DIAGNOSIS — E063 Autoimmune thyroiditis: Secondary | ICD-10-CM | POA: Diagnosis not present

## 2017-06-24 ENCOUNTER — Ambulatory Visit: Payer: Self-pay | Admitting: Orthopedic Surgery

## 2017-06-26 ENCOUNTER — Telehealth: Payer: Self-pay | Admitting: Orthopedic Surgery

## 2017-06-26 NOTE — Telephone Encounter (Signed)
Is she using Diclofenac ? I called her to advise her to continue this left message

## 2017-06-26 NOTE — Telephone Encounter (Signed)
She will try the Zantac with the Diclofenac once a day and see if this helps.

## 2017-06-26 NOTE — Telephone Encounter (Signed)
Patient called and re-scheduled her appointment from 06/24/17, which she cancelled due to illness. She is asking if there is any medication that may help her until her re-scheduled visit on 07/19/17? If so, pharmacy is [facility] in [place].

## 2017-07-12 DIAGNOSIS — R42 Dizziness and giddiness: Secondary | ICD-10-CM | POA: Diagnosis not present

## 2017-07-12 DIAGNOSIS — Z8669 Personal history of other diseases of the nervous system and sense organs: Secondary | ICD-10-CM | POA: Diagnosis not present

## 2017-07-12 DIAGNOSIS — H9319 Tinnitus, unspecified ear: Secondary | ICD-10-CM | POA: Diagnosis not present

## 2017-07-12 DIAGNOSIS — Z87891 Personal history of nicotine dependence: Secondary | ICD-10-CM | POA: Diagnosis not present

## 2017-07-12 DIAGNOSIS — Z79899 Other long term (current) drug therapy: Secondary | ICD-10-CM | POA: Diagnosis not present

## 2017-07-12 DIAGNOSIS — R112 Nausea with vomiting, unspecified: Secondary | ICD-10-CM | POA: Diagnosis not present

## 2017-07-12 DIAGNOSIS — H9313 Tinnitus, bilateral: Secondary | ICD-10-CM | POA: Diagnosis not present

## 2017-07-19 ENCOUNTER — Encounter: Payer: Self-pay | Admitting: Orthopedic Surgery

## 2017-07-19 ENCOUNTER — Ambulatory Visit (INDEPENDENT_AMBULATORY_CARE_PROVIDER_SITE_OTHER): Payer: Medicare HMO | Admitting: Orthopedic Surgery

## 2017-07-19 VITALS — BP 138/82 | HR 79 | Ht 64.0 in | Wt 167.0 lb

## 2017-07-19 DIAGNOSIS — M654 Radial styloid tenosynovitis [de Quervain]: Secondary | ICD-10-CM | POA: Diagnosis not present

## 2017-07-19 NOTE — Patient Instructions (Signed)
De Quervain Tenosynovitis  Tendons attach muscles to bones. They also help with joint movements. When tendons become irritated or swollen, it is called tendinitis.  The extensor pollicis brevis (EPB) tendon connects the EPB muscle to a bone that is near the base of the thumb. The EPB muscle helps to straighten and extend the thumb. De Quervain tenosynovitis is a condition in which the EPB tendon lining (sheath) becomes irritated, thickened, and swollen. This condition is sometimes called stenosing tenosynovitis. This condition causes pain on the thumb side of the back of the wrist.  What are the causes?  Causes of this condition include:   Activities that repeatedly cause your thumb and wrist to extend.   A sudden increase in activity or change in activity that affects your wrist.    What increases the risk?  This condition is more likely to develop in:   Females.   People who have diabetes.   Women who have recently given birth.   People who are over 68 years of age.   People who do activities that involve repeated hand and wrist motions, such as tennis, racquetball, volleyball, gardening, and taking care of children.   People who do heavy labor.   People who have poor wrist strength and flexibility.   People who do not warm up properly before activities.    What are the signs or symptoms?  Symptoms of this condition include:   Pain or tenderness over the thumb side of the back of the wrist when your thumb and wrist are not moving.   Pain that gets worse when you straighten your thumb or extend your thumb or wrist.   Pain when the injured area is touched.   Locking or catching of the thumb joint while you bend and straighten your thumb.   Decreased thumb motion due to pain.   Swelling over the affected area.    How is this diagnosed?  This condition is diagnosed with a medical history and physical exam. Your health care provider will ask for details about your injury and ask about your  symptoms.  How is this treated?  Treatment may include the use of icing and medicines to reduce pain and swelling. You may also be advised to wear a splint or brace to limit your thumb and wrist motion. In less severe cases, treatment may also include working with a physical therapist to strengthen your wrist and calm the irritation around your EPB tendon sheath. In severe cases, surgery may be needed.  Follow these instructions at home:  If you have a splint or brace:   Wear it as told by your health care provider. Remove it only as told by your health care provider.   Loosen the splint or brace if your fingers become numb and tingle, or if they turn cold and blue.   Keep the splint or brace clean and dry.  Managing pain, stiffness, and swelling   If directed, apply ice to the injured area.  ? Put ice in a plastic bag.  ? Place a towel between your skin and the bag.  ? Leave the ice on for 20 minutes, 2-3 times per day.   Move your fingers often to avoid stiffness and to lessen swelling.   Raise (elevate) the injured area above the level of your heart while you are sitting or lying down.  General instructions   Return to your normal activities as told by your health care provider. Ask your health care provider   what activities are safe for you.   Take over-the-counter and prescription medicines only as told by your health care provider.   Keep all follow-up visits as told by your health care provider. This is important.   Do not drive or operate heavy machinery while taking prescription pain medicine.  Contact a health care provider if:   Your pain, tenderness, or swelling gets worse, even if you have had treatment.   You have numbness or tingling in your wrist, hand, or fingers on the injured side.  This information is not intended to replace advice given to you by your health care provider. Make sure you discuss any questions you have with your health care provider.  Document Released: 02/26/2005  Document Revised: 08/04/2015 Document Reviewed: 05/04/2014  Elsevier Interactive Patient Education  2018 [employer]

## 2017-07-19 NOTE — Progress Notes (Signed)
Progress Note   Patient ID: [name], female   DOB: [birth year], 67 y.o.   MRN: [id number] Chief Complaint  Patient presents with  . Wrist Pain    right wrist pain      Medical decision-making  Imaging:  Xrays ordered: y/n ? NO  Encounter Diagnosis  Name Primary?  Rhonda [name] Quervain's tenosynovitis, right, new problem Yes    PLAN:   Recommend splinting of the thumb continue ibuprofen with Prilosec follow-up 6 weeks   Rhonda Canada, MD 07/19/2017 10:08 AM     Chief Complaint  Patient presents with  . Wrist Pain    right wrist pain      67 year old female who I treated for tenosynovitis of the wrist she improved but now complains of painful swelling tenderness and painful ulnar deviation of the left wrist with no associated numbness or tingling no history of trauma  She was placed on Prilosec with ibuprofen which gave her better relief than tramadol      Review of Systems  Constitutional: Negative for fever.  Musculoskeletal: Positive for joint pain.  Neurological: Negative for tingling and tremors.   Current Meds  Medication Sig  . aspirin-acetaminophen-caffeine (EXCEDRIN EXTRA STRENGTH) 250-250-65 MG per tablet Take 2 tablets by mouth every 6 (six) hours as needed for headache.  . Homeopathic Products (ZICAM ALLERGY RELIEF NA) Place 1 spray into the nose daily as needed.  Marland Kitchen ibuprofen (ADVIL,MOTRIN) 200 MG tablet Take 200 mg by mouth every 6 (six) hours as needed.  Marland Kitchen levothyroxine (SYNTHROID, LEVOTHROID) 25 MCG tablet Take 25 mcg by mouth daily before breakfast.   . loratadine (CLARITIN) 10 MG tablet Take 10 mg by mouth daily.  . metoprolol succinate (TOPROL-XL) 25 MG 24 hr tablet Take 25 mg by mouth daily as needed (for blood pressure).   . traMADol (ULTRAM) 50 MG tablet Take 50-100 mg by mouth 2 (two) times daily as needed. For pain    Past Medical History:  Diagnosis Date  . Hypertension   . Migraine   . Thyroid disease      No Known  Allergies  BP 138/82   Pulse 79   Ht 5\' 4"  (1.626 m)   Wt 167 lb (75.8 kg)   BMI 28.67 kg/m     Physical Exam  Constitutional: She is oriented to person, place, and time. She appears well-developed and well-nourished.  Neurological: She is alert and oriented to person, place, and time.  Psychiatric: She has a normal mood and affect. Judgment normal.  Vitals reviewed.   Ortho Exam   Examination of the wrist the swelling in the wrist and hand area is resolved from last time however she is tender to palpation of the first extensor compartment she has painful range of motion wrist extension wrist flexion ulnar deviation wrist joint is stable grip strength is normal skin shows no rash pulse and perfusion are normal negative lymph nodes and sensation is intact  Positive Finkelstein's test

## 2017-07-24 DIAGNOSIS — H81399 Other peripheral vertigo, unspecified ear: Secondary | ICD-10-CM | POA: Diagnosis not present

## 2017-08-12 DIAGNOSIS — M2548 Effusion, other site: Secondary | ICD-10-CM | POA: Diagnosis not present

## 2017-08-12 DIAGNOSIS — H9313 Tinnitus, bilateral: Secondary | ICD-10-CM | POA: Diagnosis not present

## 2017-09-03 ENCOUNTER — Ambulatory Visit: Payer: Medicare HMO | Admitting: Orthopedic Surgery

## 2018-03-12 ENCOUNTER — Other Ambulatory Visit (HOSPITAL_COMMUNITY): Payer: Self-pay | Admitting: Internal Medicine

## 2018-03-12 DIAGNOSIS — Z1231 Encounter for screening mammogram for malignant neoplasm of breast: Secondary | ICD-10-CM

## 2018-04-14 ENCOUNTER — Ambulatory Visit (HOSPITAL_COMMUNITY): Payer: Medicare HMO

## 2018-04-21 ENCOUNTER — Ambulatory Visit (HOSPITAL_COMMUNITY)
Admission: RE | Admit: 2018-04-21 | Discharge: 2018-04-21 | Disposition: A | Discharge status: Home / Self Care | Payer: Medicare HMO | Source: Ambulatory Visit | Attending: Internal Medicine | Admitting: Internal Medicine

## 2018-04-21 DIAGNOSIS — R110 Nausea: Secondary | ICD-10-CM | POA: Diagnosis not present

## 2018-04-21 DIAGNOSIS — E663 Overweight: Secondary | ICD-10-CM | POA: Diagnosis not present

## 2018-04-21 DIAGNOSIS — I10 Essential (primary) hypertension: Secondary | ICD-10-CM | POA: Diagnosis not present

## 2018-04-21 DIAGNOSIS — Z1231 Encounter for screening mammogram for malignant neoplasm of breast: Secondary | ICD-10-CM | POA: Insufficient documentation

## 2018-04-21 DIAGNOSIS — G4700 Insomnia, unspecified: Secondary | ICD-10-CM | POA: Diagnosis not present

## 2018-04-21 DIAGNOSIS — E039 Hypothyroidism, unspecified: Secondary | ICD-10-CM | POA: Diagnosis not present

## 2018-04-21 DIAGNOSIS — K581 Irritable bowel syndrome with constipation: Secondary | ICD-10-CM | POA: Diagnosis not present

## 2018-04-21 DIAGNOSIS — R42 Dizziness and giddiness: Secondary | ICD-10-CM | POA: Diagnosis not present

## 2018-04-24 ENCOUNTER — Other Ambulatory Visit (HOSPITAL_COMMUNITY): Payer: Self-pay | Admitting: Internal Medicine

## 2018-04-24 DIAGNOSIS — Z780 Asymptomatic menopausal state: Secondary | ICD-10-CM

## 2018-05-01 ENCOUNTER — Other Ambulatory Visit (HOSPITAL_COMMUNITY): Payer: Medicare HMO

## 2018-05-07 DIAGNOSIS — J060 Acute laryngopharyngitis: Secondary | ICD-10-CM | POA: Diagnosis not present

## 2018-05-07 DIAGNOSIS — I10 Essential (primary) hypertension: Secondary | ICD-10-CM | POA: Diagnosis not present

## 2018-05-08 ENCOUNTER — Other Ambulatory Visit (HOSPITAL_COMMUNITY): Payer: Medicare HMO

## 2018-05-12 DIAGNOSIS — K581 Irritable bowel syndrome with constipation: Secondary | ICD-10-CM | POA: Diagnosis not present

## 2018-05-12 DIAGNOSIS — G4700 Insomnia, unspecified: Secondary | ICD-10-CM | POA: Diagnosis not present

## 2018-05-12 DIAGNOSIS — E782 Mixed hyperlipidemia: Secondary | ICD-10-CM | POA: Diagnosis not present

## 2018-05-12 DIAGNOSIS — I10 Essential (primary) hypertension: Secondary | ICD-10-CM | POA: Diagnosis not present

## 2018-05-12 DIAGNOSIS — J060 Acute laryngopharyngitis: Secondary | ICD-10-CM | POA: Diagnosis not present

## 2018-05-12 DIAGNOSIS — E663 Overweight: Secondary | ICD-10-CM | POA: Diagnosis not present

## 2018-05-12 DIAGNOSIS — E039 Hypothyroidism, unspecified: Secondary | ICD-10-CM | POA: Diagnosis not present

## 2018-05-12 DIAGNOSIS — R42 Dizziness and giddiness: Secondary | ICD-10-CM | POA: Diagnosis not present

## 2018-05-12 DIAGNOSIS — E063 Autoimmune thyroiditis: Secondary | ICD-10-CM | POA: Diagnosis not present

## 2018-05-19 ENCOUNTER — Ambulatory Visit (HOSPITAL_COMMUNITY)
Admission: RE | Admit: 2018-05-19 | Discharge: 2018-05-19 | Disposition: A | Discharge status: Home / Self Care | Payer: Medicare HMO | Source: Ambulatory Visit | Attending: Internal Medicine | Admitting: Internal Medicine

## 2018-05-19 DIAGNOSIS — M8589 Other specified disorders of bone density and structure, multiple sites: Secondary | ICD-10-CM | POA: Diagnosis not present

## 2018-05-19 DIAGNOSIS — Z780 Asymptomatic menopausal state: Secondary | ICD-10-CM | POA: Diagnosis not present

## 2018-06-16 DIAGNOSIS — E063 Autoimmune thyroiditis: Secondary | ICD-10-CM | POA: Diagnosis not present

## 2018-06-17 DIAGNOSIS — E063 Autoimmune thyroiditis: Secondary | ICD-10-CM | POA: Diagnosis not present

## 2018-06-17 DIAGNOSIS — R768 Other specified abnormal immunological findings in serum: Secondary | ICD-10-CM | POA: Diagnosis not present

## 2018-06-30 DIAGNOSIS — Z0000 Encounter for general adult medical examination without abnormal findings: Secondary | ICD-10-CM | POA: Diagnosis not present

## 2018-07-08 DIAGNOSIS — M25551 Pain in right hip: Secondary | ICD-10-CM | POA: Diagnosis not present

## 2018-07-08 DIAGNOSIS — M7060 Trochanteric bursitis, unspecified hip: Secondary | ICD-10-CM | POA: Diagnosis not present

## 2018-09-26 DIAGNOSIS — E063 Autoimmune thyroiditis: Secondary | ICD-10-CM | POA: Diagnosis not present

## 2018-09-26 DIAGNOSIS — I10 Essential (primary) hypertension: Secondary | ICD-10-CM | POA: Diagnosis not present

## 2018-09-26 DIAGNOSIS — M779 Enthesopathy, unspecified: Secondary | ICD-10-CM | POA: Diagnosis not present

## 2018-12-23 ENCOUNTER — Other Ambulatory Visit: Payer: Self-pay | Admitting: Orthopedic Surgery

## 2018-12-23 DIAGNOSIS — M67331 Transient synovitis, right wrist: Secondary | ICD-10-CM

## 2019-02-09 ENCOUNTER — Ambulatory Visit: Payer: Medicare HMO | Admitting: Orthopedic Surgery

## 2019-02-23 ENCOUNTER — Ambulatory Visit: Payer: Medicare HMO | Admitting: Orthopedic Surgery

## 2019-02-23 ENCOUNTER — Other Ambulatory Visit: Payer: Self-pay

## 2019-02-23 ENCOUNTER — Ambulatory Visit: Payer: Medicare HMO

## 2019-02-23 ENCOUNTER — Encounter: Payer: Self-pay | Admitting: Orthopedic Surgery

## 2019-02-23 VITALS — BP 136/81 | HR 80 | Ht 64.0 in | Wt 165.0 lb

## 2019-02-23 DIAGNOSIS — M24131 Other articular cartilage disorders, right wrist: Secondary | ICD-10-CM

## 2019-02-23 DIAGNOSIS — M65311 Trigger thumb, right thumb: Secondary | ICD-10-CM | POA: Diagnosis not present

## 2019-02-23 DIAGNOSIS — M65312 Trigger thumb, left thumb: Secondary | ICD-10-CM

## 2019-02-23 DIAGNOSIS — M25531 Pain in right wrist: Secondary | ICD-10-CM

## 2019-02-23 DIAGNOSIS — M79642 Pain in left hand: Secondary | ICD-10-CM | POA: Diagnosis not present

## 2019-02-23 MED ORDER — GABAPENTIN 100 MG PO CAPS: 100.0000 mg | ORAL_CAPSULE | Freq: Three times a day (TID) | ORAL | 0 refills | Status: DC

## 2019-02-23 NOTE — Patient Instructions (Addendum)
You have received an injection of steroids into the joint. 15% of patients will have increased pain within the 24 hours postinjection.   This is transient and will go away.   We recommend that you use ice packs on the injection site for 20 minutes every 2 hours and extra strength Tylenol 2 tablets every 8 as needed until the pain resolves.  If you continue to have pain after taking the Tylenol and using the ice please call the office for further instructions.   Trigger Finger  Trigger finger (stenosing tenosynovitis) is a condition that causes a finger to get stuck in a bent position. Each finger has a tough, cord-like tissue that connects muscle to bone (tendon), and each tendon is surrounded by a tunnel of tissue (tendon sheath). To move your finger, your tendon needs to slide freely through the sheath. Trigger finger happens when the tendon or the sheath thickens, making it difficult to move your finger. Trigger finger can affect any finger or a thumb. It may affect more than one finger. Mild cases may clear up with rest and medicine. Severe cases require more treatment. What are the causes? Trigger finger is caused by a thickened finger tendon or tendon sheath. The cause of this thickening is not known. What increases the risk? The following factors may make you more likely to develop this condition:  Doing activities that require a strong grip.  Having rheumatoid arthritis, gout, or diabetes.  Being 8-61 years old.  Being a woman. What are the signs or symptoms? Symptoms of this condition include:  Pain when bending or straightening your finger.  Tenderness or swelling where your finger attaches to the palm of your hand.  A lump in the palm of your hand or on the inside of your finger.  Hearing a popping sound when you try to straighten your finger.  Feeling a popping, catching, or locking sensation when you try to straighten your finger.  Being unable to straighten your  finger. How is this diagnosed? This condition is diagnosed based on your symptoms and a physical exam. How is this treated? This condition may be treated by:  Resting your finger and avoiding activities that make symptoms worse.  Wearing a finger splint to keep your finger in a slightly bent position.  Taking NSAIDs to relieve pain and swelling.  Injecting medicine (steroids) into the tendon sheath to reduce swelling and irritation. Injections may need to be repeated.  Having surgery to open the tendon sheath. This may be done if other treatments do not work and you cannot straighten your finger. You may need physical therapy after surgery. Follow these instructions at home:   Use moist heat to help reduce pain and swelling as told by your health care provider.  Rest your finger and avoid activities that make pain worse. Return to normal activities as told by your health care provider.  If you have a splint, wear it as told by your health care provider.  Take over-the-counter and prescription medicines only as told by your health care provider.  Keep all follow-up visits as told by your health care provider. This is important. Contact a health care provider if:  Your symptoms are not improving with home care. Summary  Trigger finger (stenosing tenosynovitis) causes your finger to get stuck in a bent position, and it can make it difficult and painful to straighten your finger.  This condition develops when a finger tendon or tendon sheath thickens.  Treatment starts with resting,  wearing a splint, and taking NSAIDs.  In severe cases, surgery to open the tendon sheath may be needed. This information is not intended to replace advice given to you by your health care provider. Make sure you discuss any questions you have with your health care provider. Document Released: 12/17/2003 Document Revised: 02/08/2017 Document Reviewed: 02/07/2016 Elsevier Patient Education  2020  [employer].

## 2019-02-23 NOTE — Progress Notes (Signed)
  Chief Complaint  Patient presents with  . Hand Pain    left greater than right wrist and thumb   68 year old female presents with bilateral thumb pain left worse than right and right wrist and ulnar-sided wrist pain as well as the wrist joint for which she has taken diclofenac with no relief.  She takes Advil that seems to help more but has not relieved her symptoms which is been present now for over a month and includes severe pain and swelling  She notices trouble with activities of daily living especially opening and closing bottles going to the bathroom pulling up her close  Review of Systems  Constitutional: Negative for chills, fever and weight loss.  Gastrointestinal: Positive for constipation.  Musculoskeletal: Positive for joint pain and myalgias.   Past Medical History:  Diagnosis Date  . Hypertension   . Migraine   . Thyroid disease     BP 136/81   Pulse 80   Ht 5\' 4"  (1.626 m)   Wt 165 lb (74.8 kg)   BMI 28.32 kg/m   Physical Exam Constitutional:      General: She is not in acute distress.    Appearance: Normal appearance. She is not ill-appearing, toxic-appearing or diaphoretic.  Musculoskeletal:     Comments: Right hand and wrist Tenderness over the TFCC dorsal wrist joint and A1 pulley right thumb With continued normal range of motion in the right thumb normal tendon function no joint subluxation or instability Skin warm dry intact no rash  Left thumb much more swollen over the A1 pulley with a locked flexor tendon no subluxation dislocation skin warm dry and intact tendon function probably normal  Neurological:     Mental Status: She is alert and oriented to person, place, and time.  Psychiatric:        Mood and Affect: Mood normal.        Behavior: Behavior normal.        Thought Content: Thought content normal.        Judgment: Judgment normal.    Will go ahead and inject both thumbs but her TFCC issue and wrist joint will have to be addressed by  hand surgery  Trigger finger injection  Diagnosis left thumb Procedure injection A1 pulley Medications lidocaine 1% 1 mL and Depo-Medrol 40 mg 1 mL Skin prep alcohol and ethyl chloride Verbal consent was obtained Timeout confirmed the injection site  After cleaning the skin with alcohol and anesthetizing the skin with ethyl chloride the A1 pulley was palpated and the injection was performed without complication   Right Trigger thumb injection Medication  1 mL of 40 mg Depo-Medrol  2 mL of 1% lidocaine plain  Ethyl chloride for anesthesia  Verbal consent was obtained timeout was taken to confirm the injection site as right thumb  Alcohol was used to prepare the skin along with ethyl chloride and then the injection was made at the A1 pulley there were no complications  Encounter Diagnoses  Name Primary?  . Pain in left hand Yes  . Trigger thumb, left thumb   . Trigger thumb of right hand   . Degenerative tear of triangular fibrocartilage complex (TFCC) of right wrist   . Arthralgia of right wrist     Referral will be made to hand center in [place] to evaluate the wrist joint and TFCC  She can follow-up here if she needs repeat injections for her thumbs

## 2019-03-12 DIAGNOSIS — M65312 Trigger thumb, left thumb: Secondary | ICD-10-CM | POA: Diagnosis not present

## 2019-03-12 DIAGNOSIS — S63093A Other subluxation of unspecified wrist and hand, initial encounter: Secondary | ICD-10-CM | POA: Diagnosis not present

## 2019-03-12 DIAGNOSIS — M65311 Trigger thumb, right thumb: Secondary | ICD-10-CM | POA: Diagnosis not present

## 2019-03-16 DIAGNOSIS — M25531 Pain in right wrist: Secondary | ICD-10-CM | POA: Diagnosis not present

## 2019-03-16 DIAGNOSIS — M25431 Effusion, right wrist: Secondary | ICD-10-CM | POA: Diagnosis not present

## 2019-03-19 ENCOUNTER — Other Ambulatory Visit (HOSPITAL_COMMUNITY): Payer: Self-pay | Admitting: Internal Medicine

## 2019-03-19 DIAGNOSIS — Z1231 Encounter for screening mammogram for malignant neoplasm of breast: Secondary | ICD-10-CM

## 2019-04-09 ENCOUNTER — Other Ambulatory Visit: Payer: Self-pay | Admitting: Orthopedic Surgery

## 2019-04-09 DIAGNOSIS — M67331 Transient synovitis, right wrist: Secondary | ICD-10-CM

## 2019-04-21 DIAGNOSIS — E063 Autoimmune thyroiditis: Secondary | ICD-10-CM | POA: Diagnosis not present

## 2019-04-21 DIAGNOSIS — R42 Dizziness and giddiness: Secondary | ICD-10-CM | POA: Diagnosis not present

## 2019-04-21 DIAGNOSIS — R519 Headache, unspecified: Secondary | ICD-10-CM | POA: Diagnosis not present

## 2019-04-21 DIAGNOSIS — G43909 Migraine, unspecified, not intractable, without status migrainosus: Secondary | ICD-10-CM | POA: Diagnosis not present

## 2019-04-21 DIAGNOSIS — M65312 Trigger thumb, left thumb: Secondary | ICD-10-CM | POA: Diagnosis not present

## 2019-04-21 DIAGNOSIS — T753XXD Motion sickness, subsequent encounter: Secondary | ICD-10-CM | POA: Diagnosis not present

## 2019-04-21 DIAGNOSIS — X58XXXD Exposure to other specified factors, subsequent encounter: Secondary | ICD-10-CM | POA: Diagnosis not present

## 2019-04-24 ENCOUNTER — Ambulatory Visit (HOSPITAL_COMMUNITY): Payer: Medicare HMO

## 2019-05-06 DIAGNOSIS — I10 Essential (primary) hypertension: Secondary | ICD-10-CM | POA: Diagnosis not present

## 2019-05-06 DIAGNOSIS — Z1329 Encounter for screening for other suspected endocrine disorder: Secondary | ICD-10-CM | POA: Diagnosis not present

## 2019-05-06 DIAGNOSIS — Z0000 Encounter for general adult medical examination without abnormal findings: Secondary | ICD-10-CM | POA: Diagnosis not present

## 2019-05-06 DIAGNOSIS — Z712 Person consulting for explanation of examination or test findings: Secondary | ICD-10-CM | POA: Diagnosis not present

## 2019-05-07 ENCOUNTER — Other Ambulatory Visit: Payer: Self-pay

## 2019-05-07 ENCOUNTER — Ambulatory Visit (HOSPITAL_COMMUNITY)
Admission: RE | Admit: 2019-05-07 | Discharge: 2019-05-07 | Disposition: A | Discharge status: Home / Self Care | Payer: Medicare HMO | Source: Ambulatory Visit | Attending: Internal Medicine | Admitting: Internal Medicine

## 2019-05-07 DIAGNOSIS — Z1231 Encounter for screening mammogram for malignant neoplasm of breast: Secondary | ICD-10-CM | POA: Diagnosis not present

## 2019-05-07 DIAGNOSIS — K581 Irritable bowel syndrome with constipation: Secondary | ICD-10-CM | POA: Diagnosis not present

## 2019-05-07 DIAGNOSIS — R42 Dizziness and giddiness: Secondary | ICD-10-CM | POA: Diagnosis not present

## 2019-05-07 DIAGNOSIS — G43109 Migraine with aura, not intractable, without status migrainosus: Secondary | ICD-10-CM | POA: Diagnosis not present

## 2019-05-07 DIAGNOSIS — R69 Illness, unspecified: Secondary | ICD-10-CM | POA: Diagnosis not present

## 2019-05-07 DIAGNOSIS — E039 Hypothyroidism, unspecified: Secondary | ICD-10-CM | POA: Diagnosis not present

## 2019-05-07 DIAGNOSIS — I10 Essential (primary) hypertension: Secondary | ICD-10-CM | POA: Diagnosis not present

## 2019-05-07 DIAGNOSIS — E063 Autoimmune thyroiditis: Secondary | ICD-10-CM | POA: Diagnosis not present

## 2019-05-07 DIAGNOSIS — G4700 Insomnia, unspecified: Secondary | ICD-10-CM | POA: Diagnosis not present

## 2019-05-07 DIAGNOSIS — E782 Mixed hyperlipidemia: Secondary | ICD-10-CM | POA: Diagnosis not present

## 2019-05-07 DIAGNOSIS — E663 Overweight: Secondary | ICD-10-CM | POA: Diagnosis not present

## 2019-05-26 ENCOUNTER — Ambulatory Visit: Payer: Medicare HMO | Attending: Internal Medicine

## 2019-05-26 ENCOUNTER — Other Ambulatory Visit: Payer: Self-pay

## 2019-05-26 DIAGNOSIS — Z20822 Contact with and (suspected) exposure to covid-19: Secondary | ICD-10-CM | POA: Diagnosis not present

## 2019-05-27 LAB — NOVEL CORONAVIRUS, NAA: SARS-CoV-2, NAA: NOT DETECTED

## 2019-06-16 DIAGNOSIS — R768 Other specified abnormal immunological findings in serum: Secondary | ICD-10-CM | POA: Diagnosis not present

## 2019-06-16 DIAGNOSIS — E063 Autoimmune thyroiditis: Secondary | ICD-10-CM | POA: Diagnosis not present

## 2019-06-22 DIAGNOSIS — E063 Autoimmune thyroiditis: Secondary | ICD-10-CM | POA: Diagnosis not present

## 2019-06-22 DIAGNOSIS — R768 Other specified abnormal immunological findings in serum: Secondary | ICD-10-CM | POA: Diagnosis not present

## 2019-07-12 ENCOUNTER — Other Ambulatory Visit: Payer: Self-pay | Admitting: Orthopedic Surgery

## 2019-07-12 DIAGNOSIS — M67331 Transient synovitis, right wrist: Secondary | ICD-10-CM

## 2019-07-14 DIAGNOSIS — T39395A Adverse effect of other nonsteroidal anti-inflammatory drugs [NSAID], initial encounter: Secondary | ICD-10-CM | POA: Diagnosis not present

## 2019-07-14 DIAGNOSIS — R1084 Generalized abdominal pain: Secondary | ICD-10-CM | POA: Diagnosis not present

## 2019-07-14 DIAGNOSIS — B379 Candidiasis, unspecified: Secondary | ICD-10-CM | POA: Diagnosis not present

## 2019-08-04 ENCOUNTER — Telehealth: Payer: Self-pay | Admitting: Radiology

## 2019-08-04 DIAGNOSIS — M79641 Pain in right hand: Secondary | ICD-10-CM | POA: Diagnosis not present

## 2019-08-04 DIAGNOSIS — M79642 Pain in left hand: Secondary | ICD-10-CM | POA: Diagnosis not present

## 2019-08-04 DIAGNOSIS — M65311 Trigger thumb, right thumb: Secondary | ICD-10-CM | POA: Diagnosis not present

## 2019-08-04 DIAGNOSIS — M65312 Trigger thumb, left thumb: Secondary | ICD-10-CM | POA: Diagnosis not present

## 2019-08-04 NOTE — Telephone Encounter (Signed)
FYI  Patient called, said her hand is hurting badly and she can hardly even turn the steering wheel.  She is asking for a prescription of prednisone, which Dr Romeo Apple offered in December 2020 at her last visit, and she declined.  She says the diclofenac is not helping her as much now. I advised her that we Webber Michiels need to see her before prescribing.  Then I see in her chart that she is seeing Dr Mina Marble today for this same problem.  I have advised her to let Dr Mina Marble evaluate and prescribe any treatment for this since she is seeing him.  She will call us if any further needed.

## 2019-09-03 DIAGNOSIS — Z6827 Body mass index (BMI) 27.0-27.9, adult: Secondary | ICD-10-CM | POA: Diagnosis not present

## 2019-09-03 DIAGNOSIS — R9389 Abnormal findings on diagnostic imaging of other specified body structures: Secondary | ICD-10-CM | POA: Diagnosis not present

## 2019-09-03 DIAGNOSIS — E663 Overweight: Secondary | ICD-10-CM | POA: Diagnosis not present

## 2019-09-03 DIAGNOSIS — M7989 Other specified soft tissue disorders: Secondary | ICD-10-CM | POA: Diagnosis not present

## 2019-09-03 DIAGNOSIS — M25531 Pain in right wrist: Secondary | ICD-10-CM | POA: Diagnosis not present

## 2019-09-10 DIAGNOSIS — M7989 Other specified soft tissue disorders: Secondary | ICD-10-CM | POA: Diagnosis not present

## 2019-09-10 DIAGNOSIS — R9389 Abnormal findings on diagnostic imaging of other specified body structures: Secondary | ICD-10-CM | POA: Diagnosis not present

## 2019-09-10 DIAGNOSIS — E663 Overweight: Secondary | ICD-10-CM | POA: Diagnosis not present

## 2019-09-10 DIAGNOSIS — Z6827 Body mass index (BMI) 27.0-27.9, adult: Secondary | ICD-10-CM | POA: Diagnosis not present

## 2019-09-10 DIAGNOSIS — M0579 Rheumatoid arthritis with rheumatoid factor of multiple sites without organ or systems involvement: Secondary | ICD-10-CM | POA: Diagnosis not present

## 2019-09-10 DIAGNOSIS — M25531 Pain in right wrist: Secondary | ICD-10-CM | POA: Diagnosis not present

## 2019-09-29 ENCOUNTER — Other Ambulatory Visit: Payer: Self-pay | Admitting: Orthopedic Surgery

## 2019-09-29 DIAGNOSIS — M67331 Transient synovitis, right wrist: Secondary | ICD-10-CM

## 2019-10-01 DIAGNOSIS — T753XXD Motion sickness, subsequent encounter: Secondary | ICD-10-CM | POA: Diagnosis not present

## 2019-10-01 DIAGNOSIS — R194 Change in bowel habit: Secondary | ICD-10-CM | POA: Diagnosis not present

## 2019-10-01 DIAGNOSIS — R21 Rash and other nonspecific skin eruption: Secondary | ICD-10-CM | POA: Diagnosis not present

## 2019-10-01 DIAGNOSIS — G894 Chronic pain syndrome: Secondary | ICD-10-CM | POA: Diagnosis not present

## 2019-10-29 DIAGNOSIS — Z96649 Presence of unspecified artificial hip joint: Secondary | ICD-10-CM | POA: Diagnosis not present

## 2019-10-29 DIAGNOSIS — R32 Unspecified urinary incontinence: Secondary | ICD-10-CM | POA: Diagnosis not present

## 2019-10-29 DIAGNOSIS — E663 Overweight: Secondary | ICD-10-CM | POA: Diagnosis not present

## 2019-10-29 DIAGNOSIS — I10 Essential (primary) hypertension: Secondary | ICD-10-CM | POA: Diagnosis not present

## 2019-10-29 DIAGNOSIS — G8929 Other chronic pain: Secondary | ICD-10-CM | POA: Diagnosis not present

## 2019-10-29 DIAGNOSIS — Z79899 Other long term (current) drug therapy: Secondary | ICD-10-CM | POA: Diagnosis not present

## 2019-10-29 DIAGNOSIS — E039 Hypothyroidism, unspecified: Secondary | ICD-10-CM | POA: Diagnosis not present

## 2019-10-29 DIAGNOSIS — M069 Rheumatoid arthritis, unspecified: Secondary | ICD-10-CM | POA: Diagnosis not present

## 2019-11-09 DIAGNOSIS — R9389 Abnormal findings on diagnostic imaging of other specified body structures: Secondary | ICD-10-CM | POA: Diagnosis not present

## 2019-11-09 DIAGNOSIS — M0579 Rheumatoid arthritis with rheumatoid factor of multiple sites without organ or systems involvement: Secondary | ICD-10-CM | POA: Diagnosis not present

## 2019-11-09 DIAGNOSIS — Z6827 Body mass index (BMI) 27.0-27.9, adult: Secondary | ICD-10-CM | POA: Diagnosis not present

## 2019-11-09 DIAGNOSIS — M25531 Pain in right wrist: Secondary | ICD-10-CM | POA: Diagnosis not present

## 2019-11-09 DIAGNOSIS — E663 Overweight: Secondary | ICD-10-CM | POA: Diagnosis not present

## 2019-11-09 DIAGNOSIS — M7989 Other specified soft tissue disorders: Secondary | ICD-10-CM | POA: Diagnosis not present

## 2019-12-07 DIAGNOSIS — J449 Chronic obstructive pulmonary disease, unspecified: Secondary | ICD-10-CM | POA: Diagnosis not present

## 2019-12-07 DIAGNOSIS — M1611 Unilateral primary osteoarthritis, right hip: Secondary | ICD-10-CM | POA: Diagnosis not present

## 2019-12-07 DIAGNOSIS — L03312 Cellulitis of back [any part except buttock]: Secondary | ICD-10-CM | POA: Diagnosis not present

## 2019-12-16 ENCOUNTER — Other Ambulatory Visit: Payer: Self-pay

## 2019-12-16 DIAGNOSIS — M67331 Transient synovitis, right wrist: Secondary | ICD-10-CM

## 2019-12-16 MED ORDER — DICLOFENAC SODIUM 75 MG PO TBEC: 75.0000 mg | DELAYED_RELEASE_TABLET | Freq: Two times a day (BID) | ORAL | 1 refills | Status: DC

## 2020-01-12 DIAGNOSIS — E663 Overweight: Secondary | ICD-10-CM | POA: Diagnosis not present

## 2020-01-12 DIAGNOSIS — M7989 Other specified soft tissue disorders: Secondary | ICD-10-CM | POA: Diagnosis not present

## 2020-01-12 DIAGNOSIS — R9389 Abnormal findings on diagnostic imaging of other specified body structures: Secondary | ICD-10-CM | POA: Diagnosis not present

## 2020-01-12 DIAGNOSIS — M0579 Rheumatoid arthritis with rheumatoid factor of multiple sites without organ or systems involvement: Secondary | ICD-10-CM | POA: Diagnosis not present

## 2020-01-12 DIAGNOSIS — Z6826 Body mass index (BMI) 26.0-26.9, adult: Secondary | ICD-10-CM | POA: Diagnosis not present

## 2020-01-12 DIAGNOSIS — M25531 Pain in right wrist: Secondary | ICD-10-CM | POA: Diagnosis not present

## 2020-02-29 DIAGNOSIS — I10 Essential (primary) hypertension: Secondary | ICD-10-CM | POA: Diagnosis not present

## 2020-02-29 DIAGNOSIS — G43109 Migraine with aura, not intractable, without status migrainosus: Secondary | ICD-10-CM | POA: Diagnosis not present

## 2020-02-29 DIAGNOSIS — N3090 Cystitis, unspecified without hematuria: Secondary | ICD-10-CM | POA: Diagnosis not present

## 2020-02-29 DIAGNOSIS — J060 Acute laryngopharyngitis: Secondary | ICD-10-CM | POA: Diagnosis not present

## 2020-02-29 DIAGNOSIS — Z712 Person consulting for explanation of examination or test findings: Secondary | ICD-10-CM | POA: Diagnosis not present

## 2020-02-29 DIAGNOSIS — Z0000 Encounter for general adult medical examination without abnormal findings: Secondary | ICD-10-CM | POA: Diagnosis not present

## 2020-02-29 DIAGNOSIS — R69 Illness, unspecified: Secondary | ICD-10-CM | POA: Diagnosis not present

## 2020-02-29 DIAGNOSIS — T39395A Adverse effect of other nonsteroidal anti-inflammatory drugs [NSAID], initial encounter: Secondary | ICD-10-CM | POA: Diagnosis not present

## 2020-02-29 DIAGNOSIS — E782 Mixed hyperlipidemia: Secondary | ICD-10-CM | POA: Diagnosis not present

## 2020-02-29 DIAGNOSIS — R1084 Generalized abdominal pain: Secondary | ICD-10-CM | POA: Diagnosis not present

## 2020-02-29 DIAGNOSIS — L03312 Cellulitis of back [any part except buttock]: Secondary | ICD-10-CM | POA: Diagnosis not present

## 2020-03-10 ENCOUNTER — Other Ambulatory Visit: Payer: Self-pay | Admitting: Orthopedic Surgery

## 2020-03-10 DIAGNOSIS — M67331 Transient synovitis, right wrist: Secondary | ICD-10-CM

## 2020-03-16 ENCOUNTER — Other Ambulatory Visit: Payer: Self-pay | Admitting: Orthopedic Surgery

## 2020-03-16 DIAGNOSIS — M67331 Transient synovitis, right wrist: Secondary | ICD-10-CM

## 2020-03-25 DIAGNOSIS — J209 Acute bronchitis, unspecified: Secondary | ICD-10-CM | POA: Diagnosis not present

## 2020-04-06 ENCOUNTER — Other Ambulatory Visit (HOSPITAL_COMMUNITY): Payer: Self-pay | Admitting: Internal Medicine

## 2020-04-06 DIAGNOSIS — Z1231 Encounter for screening mammogram for malignant neoplasm of breast: Secondary | ICD-10-CM

## 2020-04-18 DIAGNOSIS — M25531 Pain in right wrist: Secondary | ICD-10-CM | POA: Diagnosis not present

## 2020-04-18 DIAGNOSIS — Z6827 Body mass index (BMI) 27.0-27.9, adult: Secondary | ICD-10-CM | POA: Diagnosis not present

## 2020-04-18 DIAGNOSIS — E663 Overweight: Secondary | ICD-10-CM | POA: Diagnosis not present

## 2020-04-18 DIAGNOSIS — R9389 Abnormal findings on diagnostic imaging of other specified body structures: Secondary | ICD-10-CM | POA: Diagnosis not present

## 2020-04-18 DIAGNOSIS — M0579 Rheumatoid arthritis with rheumatoid factor of multiple sites without organ or systems involvement: Secondary | ICD-10-CM | POA: Diagnosis not present

## 2020-05-09 ENCOUNTER — Ambulatory Visit (HOSPITAL_COMMUNITY): Payer: Medicare HMO

## 2020-05-11 ENCOUNTER — Other Ambulatory Visit: Payer: Self-pay

## 2020-05-11 ENCOUNTER — Ambulatory Visit (HOSPITAL_COMMUNITY)
Admission: RE | Admit: 2020-05-11 | Discharge: 2020-05-11 | Disposition: A | Discharge status: Home / Self Care | Payer: Medicare HMO | Source: Ambulatory Visit | Attending: Internal Medicine | Admitting: Internal Medicine

## 2020-05-11 DIAGNOSIS — Z1231 Encounter for screening mammogram for malignant neoplasm of breast: Secondary | ICD-10-CM | POA: Insufficient documentation

## 2020-05-12 DIAGNOSIS — R5382 Chronic fatigue, unspecified: Secondary | ICD-10-CM | POA: Diagnosis not present

## 2020-05-13 DIAGNOSIS — L03312 Cellulitis of back [any part except buttock]: Secondary | ICD-10-CM | POA: Diagnosis not present

## 2020-05-13 DIAGNOSIS — R1084 Generalized abdominal pain: Secondary | ICD-10-CM | POA: Diagnosis not present

## 2020-05-13 DIAGNOSIS — K581 Irritable bowel syndrome with constipation: Secondary | ICD-10-CM | POA: Diagnosis not present

## 2020-05-13 DIAGNOSIS — T39395A Adverse effect of other nonsteroidal anti-inflammatory drugs [NSAID], initial encounter: Secondary | ICD-10-CM | POA: Diagnosis not present

## 2020-05-13 DIAGNOSIS — R69 Illness, unspecified: Secondary | ICD-10-CM | POA: Diagnosis not present

## 2020-05-13 DIAGNOSIS — Z712 Person consulting for explanation of examination or test findings: Secondary | ICD-10-CM | POA: Diagnosis not present

## 2020-05-13 DIAGNOSIS — G43109 Migraine with aura, not intractable, without status migrainosus: Secondary | ICD-10-CM | POA: Diagnosis not present

## 2020-05-13 DIAGNOSIS — J209 Acute bronchitis, unspecified: Secondary | ICD-10-CM | POA: Diagnosis not present

## 2020-05-13 DIAGNOSIS — N3090 Cystitis, unspecified without hematuria: Secondary | ICD-10-CM | POA: Diagnosis not present

## 2020-05-13 DIAGNOSIS — I10 Essential (primary) hypertension: Secondary | ICD-10-CM | POA: Diagnosis not present

## 2020-05-13 DIAGNOSIS — Z0000 Encounter for general adult medical examination without abnormal findings: Secondary | ICD-10-CM | POA: Diagnosis not present

## 2020-05-14 DIAGNOSIS — E063 Autoimmune thyroiditis: Secondary | ICD-10-CM | POA: Diagnosis not present

## 2020-05-14 DIAGNOSIS — R69 Illness, unspecified: Secondary | ICD-10-CM | POA: Diagnosis not present

## 2020-05-14 DIAGNOSIS — E663 Overweight: Secondary | ICD-10-CM | POA: Diagnosis not present

## 2020-05-14 DIAGNOSIS — D7589 Other specified diseases of blood and blood-forming organs: Secondary | ICD-10-CM | POA: Diagnosis not present

## 2020-05-14 DIAGNOSIS — I10 Essential (primary) hypertension: Secondary | ICD-10-CM | POA: Diagnosis not present

## 2020-05-14 DIAGNOSIS — K581 Irritable bowel syndrome with constipation: Secondary | ICD-10-CM | POA: Diagnosis not present

## 2020-05-14 DIAGNOSIS — R42 Dizziness and giddiness: Secondary | ICD-10-CM | POA: Diagnosis not present

## 2020-05-14 DIAGNOSIS — G43109 Migraine with aura, not intractable, without status migrainosus: Secondary | ICD-10-CM | POA: Diagnosis not present

## 2020-05-14 DIAGNOSIS — E782 Mixed hyperlipidemia: Secondary | ICD-10-CM | POA: Diagnosis not present

## 2020-05-14 DIAGNOSIS — E039 Hypothyroidism, unspecified: Secondary | ICD-10-CM | POA: Diagnosis not present

## 2020-05-29 DIAGNOSIS — Z1212 Encounter for screening for malignant neoplasm of rectum: Secondary | ICD-10-CM | POA: Diagnosis not present

## 2020-05-29 DIAGNOSIS — Z1211 Encounter for screening for malignant neoplasm of colon: Secondary | ICD-10-CM | POA: Diagnosis not present

## 2020-06-03 LAB — COLOGUARD: COLOGUARD: NEGATIVE

## 2020-08-19 DIAGNOSIS — E663 Overweight: Secondary | ICD-10-CM | POA: Diagnosis not present

## 2020-08-19 DIAGNOSIS — Z6826 Body mass index (BMI) 26.0-26.9, adult: Secondary | ICD-10-CM | POA: Diagnosis not present

## 2020-08-19 DIAGNOSIS — M25531 Pain in right wrist: Secondary | ICD-10-CM | POA: Diagnosis not present

## 2020-08-19 DIAGNOSIS — R9389 Abnormal findings on diagnostic imaging of other specified body structures: Secondary | ICD-10-CM | POA: Diagnosis not present

## 2020-08-19 DIAGNOSIS — M0579 Rheumatoid arthritis with rheumatoid factor of multiple sites without organ or systems involvement: Secondary | ICD-10-CM | POA: Diagnosis not present

## 2020-08-30 DIAGNOSIS — H52209 Unspecified astigmatism, unspecified eye: Secondary | ICD-10-CM | POA: Diagnosis not present

## 2020-08-30 DIAGNOSIS — H524 Presbyopia: Secondary | ICD-10-CM | POA: Diagnosis not present

## 2020-08-30 DIAGNOSIS — H5213 Myopia, bilateral: Secondary | ICD-10-CM | POA: Diagnosis not present

## 2020-09-09 DIAGNOSIS — E063 Autoimmune thyroiditis: Secondary | ICD-10-CM | POA: Diagnosis not present

## 2020-09-09 DIAGNOSIS — R768 Other specified abnormal immunological findings in serum: Secondary | ICD-10-CM | POA: Diagnosis not present

## 2020-12-08 DIAGNOSIS — Z0001 Encounter for general adult medical examination with abnormal findings: Secondary | ICD-10-CM | POA: Diagnosis not present

## 2020-12-08 DIAGNOSIS — I10 Essential (primary) hypertension: Secondary | ICD-10-CM | POA: Diagnosis not present

## 2020-12-15 DIAGNOSIS — G43909 Migraine, unspecified, not intractable, without status migrainosus: Secondary | ICD-10-CM | POA: Diagnosis not present

## 2020-12-15 DIAGNOSIS — Z0001 Encounter for general adult medical examination with abnormal findings: Secondary | ICD-10-CM | POA: Diagnosis not present

## 2020-12-15 DIAGNOSIS — E785 Hyperlipidemia, unspecified: Secondary | ICD-10-CM | POA: Diagnosis not present

## 2020-12-15 DIAGNOSIS — I10 Essential (primary) hypertension: Secondary | ICD-10-CM | POA: Diagnosis not present

## 2021-01-21 ENCOUNTER — Emergency Department (HOSPITAL_COMMUNITY)
Admission: EM | Admit: 2021-01-21 | Discharge: 2021-01-21 | Disposition: A | Discharge status: Home / Self Care | Payer: Medicare HMO | Attending: Emergency Medicine | Admitting: Emergency Medicine

## 2021-01-21 ENCOUNTER — Other Ambulatory Visit: Payer: Self-pay

## 2021-01-21 ENCOUNTER — Encounter (HOSPITAL_COMMUNITY): Payer: Self-pay

## 2021-01-21 ENCOUNTER — Emergency Department (HOSPITAL_COMMUNITY): Payer: Medicare HMO

## 2021-01-21 DIAGNOSIS — R6884 Jaw pain: Secondary | ICD-10-CM | POA: Diagnosis not present

## 2021-01-21 DIAGNOSIS — K118 Other diseases of salivary glands: Secondary | ICD-10-CM | POA: Diagnosis not present

## 2021-01-21 DIAGNOSIS — K1121 Acute sialoadenitis: Secondary | ICD-10-CM | POA: Diagnosis not present

## 2021-01-21 DIAGNOSIS — D72829 Elevated white blood cell count, unspecified: Secondary | ICD-10-CM | POA: Diagnosis not present

## 2021-01-21 DIAGNOSIS — M50323 Other cervical disc degeneration at C6-C7 level: Secondary | ICD-10-CM | POA: Diagnosis not present

## 2021-01-21 DIAGNOSIS — Z96649 Presence of unspecified artificial hip joint: Secondary | ICD-10-CM | POA: Diagnosis not present

## 2021-01-21 DIAGNOSIS — Z87891 Personal history of nicotine dependence: Secondary | ICD-10-CM | POA: Insufficient documentation

## 2021-01-21 DIAGNOSIS — M50322 Other cervical disc degeneration at C5-C6 level: Secondary | ICD-10-CM | POA: Diagnosis not present

## 2021-01-21 DIAGNOSIS — I10 Essential (primary) hypertension: Secondary | ICD-10-CM | POA: Insufficient documentation

## 2021-01-21 DIAGNOSIS — Z79899 Other long term (current) drug therapy: Secondary | ICD-10-CM | POA: Diagnosis not present

## 2021-01-21 DIAGNOSIS — R590 Localized enlarged lymph nodes: Secondary | ICD-10-CM | POA: Diagnosis not present

## 2021-01-21 LAB — CBC WITH DIFFERENTIAL/PLATELET
Abs Immature Granulocytes: 0.03 10*3/uL (ref 0.00–0.07)
Basophils Absolute: 0.1 10*3/uL (ref 0.0–0.1)
Basophils Relative: 1 %
Eosinophils Absolute: 0.2 10*3/uL (ref 0.0–0.5)
Eosinophils Relative: 2 %
HCT: 39.7 % (ref 36.0–46.0)
Hemoglobin: 13.4 g/dL (ref 12.0–15.0)
Immature Granulocytes: 0 %
Lymphocytes Relative: 22 %
Lymphs Abs: 2.4 10*3/uL (ref 0.7–4.0)
MCH: 33.8 pg (ref 26.0–34.0)
MCHC: 33.8 g/dL (ref 30.0–36.0)
MCV: 100.3 fL — ABNORMAL HIGH (ref 80.0–100.0)
Monocytes Absolute: 1.3 10*3/uL — ABNORMAL HIGH (ref 0.1–1.0)
Monocytes Relative: 12 %
Neutro Abs: 7 10*3/uL (ref 1.7–7.7)
Neutrophils Relative %: 63 %
Platelets: 235 10*3/uL (ref 150–400)
RBC: 3.96 MIL/uL (ref 3.87–5.11)
RDW: 13.6 % (ref 11.5–15.5)
WBC: 10.9 10*3/uL — ABNORMAL HIGH (ref 4.0–10.5)
nRBC: 0 % (ref 0.0–0.2)

## 2021-01-21 LAB — BASIC METABOLIC PANEL
Anion gap: 6 (ref 5–15)
BUN: 8 mg/dL (ref 8–23)
CO2: 26 mmol/L (ref 22–32)
Calcium: 9 mg/dL (ref 8.9–10.3)
Chloride: 108 mmol/L (ref 98–111)
Creatinine, Ser: 0.65 mg/dL (ref 0.44–1.00)
GFR, Estimated: >60 mL/min (ref >60)
Glucose, Bld: 99 mg/dL (ref 70–99)
Potassium: 3.6 mmol/L (ref 3.5–5.1)
Sodium: 140 mmol/L (ref 135–145)

## 2021-01-21 MED ORDER — ACETAMINOPHEN 325 MG PO TABS
650.0000 mg | ORAL_TABLET | Freq: Once | ORAL | Status: AC
  Administered 2021-01-21: 650 mg via ORAL
  Filled 2021-01-21: qty 2

## 2021-01-21 MED ORDER — AMOXICILLIN-POT CLAVULANATE 875-125 MG PO TABS: 1.0000 | ORAL_TABLET | Freq: Two times a day (BID) | ORAL | 0 refills | Status: DC

## 2021-01-21 MED ORDER — IOHEXOL 300 MG/ML  SOLN
75.0000 mL | Freq: Once | INTRAMUSCULAR | Status: AC | PRN
  Administered 2021-01-21: 75 mL via INTRAVENOUS

## 2021-01-21 MED ORDER — SODIUM CHLORIDE 0.9 % IV SOLN
3.0000 g | Freq: Once | INTRAVENOUS | Status: AC
  Administered 2021-01-21: 3 g via INTRAVENOUS
  Filled 2021-01-21: qty 8

## 2021-01-21 NOTE — Discharge Instructions (Addendum)
Call your primary care doctor or specialist as discussed in the next 2-3 days.   Return immediately back to the ER if:  Your symptoms worsen within the next 12-24 hours. You develop new symptoms such as new fevers, persistent vomiting, new pain, shortness of breath, or new weakness or numbness, or if you have any other concerns.   Put heat on the swollen area. Wet a clean washcloth with warm water and put it on the area. When the washcloth cools, reheat it with warm water and put it back on. Repeat these steps for 10 to 15 minutes every few hours. ?Drink lots of fluids. ?Gently massage the swollen area. ?Suck on sour or lemon-flavored hard candy. ?Take an over-the-counter medicine to treat your pain.

## 2021-01-21 NOTE — ED Notes (Signed)
Signature pad unavailable for pt to sign

## 2021-01-21 NOTE — ED Triage Notes (Signed)
Patient states that she woke yesterday with left facial swelling and pain.

## 2021-01-21 NOTE — ED Provider Notes (Signed)
[facility] EMERGENCY DEPARTMENT Provider Note   CSN: [id number] Arrival date & time: 01/21/21  0736     History Chief Complaint  Patient presents with   Facial Pain    Rhonda [name] is a 70 y.o. female.  Patient presents with chief complaint swelling/pain to the left upper neck/angle of the jaw region.  Symptoms started this morning.  She states she was fine yesterday.  She had intermittent sharp lancing pain in the left jaw/face region off and on for the past few weeks but has been doing well until she woke up this morning.  Otherwise denies any fevers or cough any vomiting or diarrhea.  Pain is made worse when she tries to open her mouth widely.      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   Stiffness of joint, not elsewhere classified, pelvic region and thigh 03/14/2011   Abnormality of gait 03/14/2011   Weakness of both legs 03/14/2011   Postoperative anemia due to acute blood loss 12/01/2010   Acute postoperative pain of extremity 11/30/2010   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/30/2010   Refusal of blood transfusions as patient is Jehovah's Witness 11/30/2010    Past Surgical History:  Procedure Laterality Date   ABDOMINAL HYSTERECTOMY     HERNIA REPAIR     HIP SURGERY     JOINT REPLACEMENT       OB History      Gravida  5   Para  5   Term  5   Preterm      AB      Living  5      SAB      IAB      Ectopic      Multiple      Live Births              Family History  Problem Relation Age of Onset   Mental illness Mother    Mental illness Brother    Mental illness Other     Social History   Tobacco Use   Smoking status: Former    Types: Cigarettes    Quit date: 04/29/1993    Years since quitting: 27.7   Smokeless tobacco: Never  Vaping Use   Vaping Use: Never used  Substance Use Topics   Alcohol use: Yes    Comment: occ   Drug use: No    Home Medications Prior to Admission medications   Medication Sig Start Date End Date Taking? Authorizing Provider  amoxicillin-clavulanate (AUGMENTIN) 875-125 MG tablet Take 1 tablet by mouth every 12 (twelve) hours. 01/21/21  Yes Cheryll Cockayne, MD  aspirin-acetaminophen-caffeine (EXCEDRIN EXTRA STRENGTH) [phone] MG per tablet Take 2 tablets by mouth every 6 (six) hours as needed for headache.    [provider]  diclofenac (VOLTAREN) 75 MG EC  tablet Take 1 tablet by mouth twice daily 03/16/20   Vickki Hearing, MD  gabapentin (NEURONTIN) 100 MG capsule Take 1 capsule (100 mg total) by mouth 3 (three) times daily for 21 days. 02/23/19 03/16/19  Vickki Hearing, MD  Homeopathic Products [facility] ALLERGY RELIEF NA) Place 1 spray into the nose daily as needed.    [provider]  ibuprofen (ADVIL,MOTRIN) 200 MG tablet Take 200 mg by mouth every 6 (six) hours as needed.    [provider]  levothyroxine (SYNTHROID, LEVOTHROID) 25 MCG tablet Take 25 mcg by mouth daily before breakfast.  10/01/15   [provider]  loratadine (CLARITIN) 10 MG tablet Take 10 mg by mouth daily.    [provider]  metoprolol succinate (TOPROL-XL) 25 MG 24 hr tablet Take 25 mg by mouth daily as needed (for blood pressure).  10/21/12   [provider]  traMADol (ULTRAM) 50 MG tablet Take 50-100 mg  by mouth 2 (two) times daily as needed. For pain    [provider]    Allergies    Patient has no known allergies.  Review of Systems   Review of Systems  Constitutional:  Negative for fever.  HENT:  Negative for ear pain.   Eyes:  Negative for pain.  Respiratory:  Negative for cough.   Cardiovascular:  Negative for chest pain.  Gastrointestinal:  Negative for abdominal pain.  Genitourinary:  Negative for flank pain.  Musculoskeletal:  Negative for back pain.  Skin:  Negative for rash.  Neurological:  Negative for headaches.   Physical Exam Updated Vital Signs BP 122/83   Pulse 76   Temp 98.9 F (37.2 C) (Oral)   Resp 18   Ht 5\' 4"  (1.626 m)   Wt 72.6 kg   SpO2 100%   BMI 27.46 kg/m   Physical Exam Constitutional:      General: She is not in acute distress.    Appearance: Normal appearance.  HENT:     Head: Normocephalic.     Nose: Nose normal.  Eyes:     Extraocular Movements: Extraocular movements intact.  Neck:     Comments: Left cervical of adenopathy is present.  Tenderness to left parotid/left angle of the jaw or region.  No fluctuance noted. Cardiovascular:     Rate and Rhythm: Normal rate.  Pulmonary:     Effort: Pulmonary effort is normal.  Musculoskeletal:        General: Normal range of motion.  Neurological:     General: No focal deficit present.     Mental Status: She is alert. Mental status is at baseline.    ED Results / Procedures / Treatments   Labs (all labs ordered are listed, but only abnormal results are displayed) Labs Reviewed  CBC WITH DIFFERENTIAL/PLATELET - Abnormal; Notable for the following components:      Result Value   WBC 10.9 (*)    MCV 100.3 (*)    Monocytes Absolute 1.3 (*)    All other components within normal limits  BASIC METABOLIC PANEL    EKG None  Radiology CT Soft Tissue Neck W Contrast  Result Date: 01/21/2021 CLINICAL DATA:  Parotid region mass left neck/parotid swelling EXAM: CT NECK WITH  CONTRAST TECHNIQUE: Multidetector CT imaging of the neck was performed using the standard protocol following the bolus administration of intravenous contrast. CONTRAST:  2mL OMNIPAQUE IOHEXOL 300 MG/ML  SOLN COMPARISON:  None. FINDINGS: Pharynx and larynx: Normal. No mass or swelling. Salivary  glands: The submandibular glands are symmetric and unremarkable. The left parotid gland is edematous and enlarged with mild surrounding fat stranding/edema and thickening of the platysma, extending into the upper neck. Approximally 1 mm calculus within the mid left parotid duct (series 3, image 26) with dilation of the parotid duct proximally. Right parotid gland is unremarkable. Thyroid: Normal. Lymph nodes: Asymmetrically enlarged left upper cervical chain nodes. Vascular: Major arteries in the neck are patent. Intracranial atherosclerosis. Limited intracranial: Negative. Visualized orbits: Negative. Mastoids and visualized paranasal sinuses: Clear. Skeleton: Moderate to severe degenerative disease at C5-C6 and C6-C7 with disc height loss, endplate sclerosis and endplate spurring. Severe craniocervical degenerative change, particularly on the left. Upper chest: Mild paraseptal emphysema in the visualized lung apices. Visualized lung apices are clear. IMPRESSION: 1. Findings compatible with left parotiditis, including edematous and enlarged left parotid gland with surrounding edema that extends into the upper neck. Approximally 1 mm calculus within the mid left parotid duct (series 3, image 26) with dilation of the parotid duct proximally. 2. Asymmetrically enlarged left upper cervical chain nodes, nonspecific but likely reactive given the above findings. Electronically Signed   By: Feliberto Harts M.D.   On: 01/21/2021 10:00    Procedures Procedures   Medications Ordered in ED Medications  Ampicillin-Sulbactam (UNASYN) 3 g in sodium chloride 0.9 % 100 mL IVPB (has no administration in time range)  acetaminophen  (TYLENOL) tablet 650 mg (650 mg Oral Given 01/21/21 0811)  iohexol (OMNIPAQUE) 300 MG/ML solution 75 mL (75 mLs Intravenous Contrast Given 01/21/21 0934)    ED Course  I have reviewed the triage vital signs and the nursing notes.  Pertinent labs & imaging results that were available during my care of the patient were reviewed by me and considered in my medical decision making (see chart for details).    MDM Rules/Calculators/A&P                           Labs are within normal limits white count mildly elevated at 10.  CT of the neck read by radiologist is concerning for parotitis with a 1 mm calculus.  Patient advised warm compress at home and antibiotic use.  Advised immediate return for fevers or worsening pain.  Otherwise advising follow-up with her primary care doctor within the week.  Final Clinical Impression(s) / ED Diagnoses Final diagnoses:  Parotitis, acute    Rx / DC Orders ED Discharge Orders          Ordered    amoxicillin-clavulanate (AUGMENTIN) 875-125 MG tablet  Every 12 hours        01/21/21 1039             Cheryll Cockayne, MD 01/21/21 1039

## 2021-01-27 DIAGNOSIS — R059 Cough, unspecified: Secondary | ICD-10-CM | POA: Diagnosis not present

## 2021-01-27 DIAGNOSIS — K1120 Sialoadenitis, unspecified: Secondary | ICD-10-CM | POA: Diagnosis not present

## 2021-01-31 DIAGNOSIS — R5383 Other fatigue: Secondary | ICD-10-CM | POA: Diagnosis not present

## 2021-01-31 DIAGNOSIS — E663 Overweight: Secondary | ICD-10-CM | POA: Diagnosis not present

## 2021-01-31 DIAGNOSIS — M159 Polyosteoarthritis, unspecified: Secondary | ICD-10-CM | POA: Diagnosis not present

## 2021-01-31 DIAGNOSIS — M2550 Pain in unspecified joint: Secondary | ICD-10-CM | POA: Diagnosis not present

## 2021-01-31 DIAGNOSIS — M0579 Rheumatoid arthritis with rheumatoid factor of multiple sites without organ or systems involvement: Secondary | ICD-10-CM | POA: Diagnosis not present

## 2021-01-31 DIAGNOSIS — Z6827 Body mass index (BMI) 27.0-27.9, adult: Secondary | ICD-10-CM | POA: Diagnosis not present

## 2021-01-31 DIAGNOSIS — Z79899 Other long term (current) drug therapy: Secondary | ICD-10-CM | POA: Diagnosis not present

## 2021-04-01 DIAGNOSIS — J069 Acute upper respiratory infection, unspecified: Secondary | ICD-10-CM | POA: Diagnosis not present

## 2021-04-07 ENCOUNTER — Other Ambulatory Visit (HOSPITAL_COMMUNITY): Payer: Self-pay | Admitting: Internal Medicine

## 2021-04-07 DIAGNOSIS — Z1231 Encounter for screening mammogram for malignant neoplasm of breast: Secondary | ICD-10-CM

## 2021-05-03 DIAGNOSIS — M0579 Rheumatoid arthritis with rheumatoid factor of multiple sites without organ or systems involvement: Secondary | ICD-10-CM | POA: Diagnosis not present

## 2021-05-03 DIAGNOSIS — Z79899 Other long term (current) drug therapy: Secondary | ICD-10-CM | POA: Diagnosis not present

## 2021-05-03 DIAGNOSIS — E663 Overweight: Secondary | ICD-10-CM | POA: Diagnosis not present

## 2021-05-03 DIAGNOSIS — Z6827 Body mass index (BMI) 27.0-27.9, adult: Secondary | ICD-10-CM | POA: Diagnosis not present

## 2021-05-03 DIAGNOSIS — M159 Polyosteoarthritis, unspecified: Secondary | ICD-10-CM | POA: Diagnosis not present

## 2021-05-03 DIAGNOSIS — M25531 Pain in right wrist: Secondary | ICD-10-CM | POA: Diagnosis not present

## 2021-05-15 ENCOUNTER — Other Ambulatory Visit: Payer: Self-pay

## 2021-05-15 ENCOUNTER — Ambulatory Visit (HOSPITAL_COMMUNITY)
Admission: RE | Admit: 2021-05-15 | Discharge: 2021-05-15 | Disposition: A | Discharge status: Home / Self Care | Payer: Medicare HMO | Source: Ambulatory Visit | Attending: Internal Medicine | Admitting: Internal Medicine

## 2021-05-15 DIAGNOSIS — Z1231 Encounter for screening mammogram for malignant neoplasm of breast: Secondary | ICD-10-CM

## 2021-06-07 ENCOUNTER — Ambulatory Visit (HOSPITAL_COMMUNITY)
Admission: RE | Admit: 2021-06-07 | Discharge: 2021-06-07 | Disposition: A | Discharge status: Home / Self Care | Payer: Medicare HMO | Source: Ambulatory Visit | Attending: Internal Medicine | Admitting: Internal Medicine

## 2021-06-07 ENCOUNTER — Other Ambulatory Visit: Payer: Self-pay | Admitting: Internal Medicine

## 2021-06-07 ENCOUNTER — Other Ambulatory Visit: Payer: Self-pay

## 2021-06-07 ENCOUNTER — Other Ambulatory Visit (HOSPITAL_COMMUNITY): Payer: Self-pay | Admitting: Internal Medicine

## 2021-06-07 DIAGNOSIS — M79604 Pain in right leg: Secondary | ICD-10-CM

## 2021-06-07 DIAGNOSIS — M79661 Pain in right lower leg: Secondary | ICD-10-CM | POA: Diagnosis not present

## 2021-06-07 DIAGNOSIS — I10 Essential (primary) hypertension: Secondary | ICD-10-CM | POA: Diagnosis not present

## 2021-06-07 DIAGNOSIS — E785 Hyperlipidemia, unspecified: Secondary | ICD-10-CM | POA: Diagnosis not present

## 2021-06-07 DIAGNOSIS — M79605 Pain in left leg: Secondary | ICD-10-CM | POA: Diagnosis not present

## 2021-07-26 DIAGNOSIS — G43909 Migraine, unspecified, not intractable, without status migrainosus: Secondary | ICD-10-CM | POA: Diagnosis not present

## 2021-07-26 DIAGNOSIS — E782 Mixed hyperlipidemia: Secondary | ICD-10-CM | POA: Diagnosis not present

## 2021-07-26 DIAGNOSIS — K5904 Chronic idiopathic constipation: Secondary | ICD-10-CM | POA: Diagnosis not present

## 2021-07-26 DIAGNOSIS — R5383 Other fatigue: Secondary | ICD-10-CM | POA: Diagnosis not present

## 2021-07-26 DIAGNOSIS — J069 Acute upper respiratory infection, unspecified: Secondary | ICD-10-CM | POA: Diagnosis not present

## 2021-08-30 DIAGNOSIS — M0579 Rheumatoid arthritis with rheumatoid factor of multiple sites without organ or systems involvement: Secondary | ICD-10-CM | POA: Diagnosis not present

## 2021-08-30 DIAGNOSIS — M1991 Primary osteoarthritis, unspecified site: Secondary | ICD-10-CM | POA: Diagnosis not present

## 2021-08-30 DIAGNOSIS — Z79899 Other long term (current) drug therapy: Secondary | ICD-10-CM | POA: Diagnosis not present

## 2021-08-30 DIAGNOSIS — E663 Overweight: Secondary | ICD-10-CM | POA: Diagnosis not present

## 2021-08-30 DIAGNOSIS — Z6827 Body mass index (BMI) 27.0-27.9, adult: Secondary | ICD-10-CM | POA: Diagnosis not present

## 2021-09-11 DIAGNOSIS — E038 Other specified hypothyroidism: Secondary | ICD-10-CM | POA: Diagnosis not present

## 2021-09-11 DIAGNOSIS — E063 Autoimmune thyroiditis: Secondary | ICD-10-CM | POA: Diagnosis not present

## 2021-09-11 DIAGNOSIS — Z79890 Hormone replacement therapy (postmenopausal): Secondary | ICD-10-CM | POA: Diagnosis not present

## 2021-11-09 DIAGNOSIS — J069 Acute upper respiratory infection, unspecified: Secondary | ICD-10-CM | POA: Diagnosis not present

## 2021-11-09 DIAGNOSIS — J029 Acute pharyngitis, unspecified: Secondary | ICD-10-CM | POA: Diagnosis not present

## 2021-11-09 DIAGNOSIS — R519 Headache, unspecified: Secondary | ICD-10-CM | POA: Diagnosis not present

## 2021-11-09 DIAGNOSIS — R059 Cough, unspecified: Secondary | ICD-10-CM | POA: Diagnosis not present

## 2021-11-28 DIAGNOSIS — M67442 Ganglion, left hand: Secondary | ICD-10-CM | POA: Diagnosis not present

## 2021-11-28 DIAGNOSIS — J040 Acute laryngitis: Secondary | ICD-10-CM | POA: Diagnosis not present

## 2021-11-30 DIAGNOSIS — M0579 Rheumatoid arthritis with rheumatoid factor of multiple sites without organ or systems involvement: Secondary | ICD-10-CM | POA: Diagnosis not present

## 2021-12-19 DIAGNOSIS — M25532 Pain in left wrist: Secondary | ICD-10-CM | POA: Diagnosis not present

## 2022-01-24 DIAGNOSIS — E782 Mixed hyperlipidemia: Secondary | ICD-10-CM | POA: Diagnosis not present

## 2022-01-31 DIAGNOSIS — Z0000 Encounter for general adult medical examination without abnormal findings: Secondary | ICD-10-CM | POA: Diagnosis not present

## 2022-01-31 DIAGNOSIS — R5383 Other fatigue: Secondary | ICD-10-CM | POA: Diagnosis not present

## 2022-01-31 DIAGNOSIS — E782 Mixed hyperlipidemia: Secondary | ICD-10-CM | POA: Diagnosis not present

## 2022-01-31 DIAGNOSIS — G43909 Migraine, unspecified, not intractable, without status migrainosus: Secondary | ICD-10-CM | POA: Diagnosis not present

## 2022-01-31 DIAGNOSIS — I10 Essential (primary) hypertension: Secondary | ICD-10-CM | POA: Diagnosis not present

## 2022-01-31 DIAGNOSIS — K5904 Chronic idiopathic constipation: Secondary | ICD-10-CM | POA: Diagnosis not present

## 2022-02-27 DIAGNOSIS — M25532 Pain in left wrist: Secondary | ICD-10-CM | POA: Diagnosis not present

## 2022-03-09 DIAGNOSIS — Z6827 Body mass index (BMI) 27.0-27.9, adult: Secondary | ICD-10-CM | POA: Diagnosis not present

## 2022-03-09 DIAGNOSIS — M1991 Primary osteoarthritis, unspecified site: Secondary | ICD-10-CM | POA: Diagnosis not present

## 2022-03-09 DIAGNOSIS — E663 Overweight: Secondary | ICD-10-CM | POA: Diagnosis not present

## 2022-03-09 DIAGNOSIS — M0579 Rheumatoid arthritis with rheumatoid factor of multiple sites without organ or systems involvement: Secondary | ICD-10-CM | POA: Diagnosis not present

## 2022-03-09 DIAGNOSIS — Z79899 Other long term (current) drug therapy: Secondary | ICD-10-CM | POA: Diagnosis not present

## 2022-03-09 DIAGNOSIS — R5383 Other fatigue: Secondary | ICD-10-CM | POA: Diagnosis not present

## 2022-03-26 ENCOUNTER — Ambulatory Visit: Payer: Medicare HMO | Admitting: Psychiatry

## 2022-03-26 NOTE — Progress Notes (Deleted)
Referring:  Cecile Sheerer, NP [place],  [place] [id number]  PCP: Celene Squibb, MD  Neurology was asked to evaluate Rhonda [name], a 72 year old female for a chief complaint of headaches.  Our recommendations of care will be communicated by shared medical record.    CC:  headaches  History provided from ***  HPI:  Medical co-morbidities: ***  The patient presents for evaluation of headaches which began***  Headache History: Onset: Triggers: Aura: Location: Quality/Description: Associated Symptoms:  Photophobia:  Phonophobia:  Nausea: Vomiting: Allodynia: Other symptoms: Worse with activity?: Duration of headaches:  Headache days per month: *** Migraine days per month: *** Headache free days per month: ***  Current Treatment: Abortive ***  Preventative ***  Prior Therapies                                 ***   LABS: ***  IMAGING:  ***  ***Imaging independently reviewed on March 26, 2022   Current Outpatient Medications on File Prior to Visit  Medication Sig Dispense Refill   amoxicillin-clavulanate (AUGMENTIN) 875-125 MG tablet Take 1 tablet by mouth every 12 (twelve) hours. 14 tablet 0   aspirin-acetaminophen-caffeine (EXCEDRIN EXTRA STRENGTH) [id number] MG per tablet Take 2 tablets by mouth every 6 (six) hours as needed for headache.     Carboxymethylcellulose Sod PF (THERATEARS) 1 % GEL Apply 1 % to eye daily.     diclofenac (VOLTAREN) 75 MG EC tablet Take 1 tablet by mouth twice daily 60 tablet 0   folic acid (FOLVITE) 1 MG tablet Take 1 mg by mouth daily.     gabapentin (NEURONTIN) 100 MG capsule Take 1 capsule (100 mg total) by mouth 3 (three) times daily for 21 days. 63 capsule 0   Homeopathic Products (ZICAM ALLERGY RELIEF NA) Place 1 spray into the nose daily as needed.     ibuprofen (ADVIL,MOTRIN) 200 MG tablet Take 200 mg by mouth every 6 (six) hours as needed.     levothyroxine (SYNTHROID, LEVOTHROID) 25 MCG tablet  Take 25 mcg by mouth daily before breakfast.      linaclotide (LINZESS) 145 MCG CAPS capsule Take 145 mcg by mouth daily before breakfast.     loratadine (CLARITIN) 10 MG tablet Take 10 mg by mouth daily.     metoprolol succinate (TOPROL-XL) 25 MG 24 hr tablet Take 25 mg by mouth daily as needed (for blood pressure).      metoprolol tartrate (LOPRESSOR) 25 MG tablet Take 25 mg by mouth 2 (two) times daily.     ondansetron (ZOFRAN) 8 MG tablet Take 8 mg by mouth every 8 (eight) hours as needed for nausea or vomiting.     Rimegepant Sulfate (NURTEC) 75 MG TBDP Take 75 mg by mouth daily.     traMADol (ULTRAM) 50 MG tablet Take 50-100 mg by mouth 2 (two) times daily as needed. For pain     No current facility-administered medications on file prior to visit.     Allergies: No Known Allergies  Family History: Migraine or other headaches in the family:  *** Aneurysms in a first degree relative:  *** Brain tumors in the family:  *** Other neurological illness in the family:   ***  Past Medical History: Past Medical History:  Diagnosis Date   Hypertension    Migraine    Thyroid disease     Past Surgical History Past Surgical  History:  Procedure Laterality Date   ABDOMINAL HYSTERECTOMY     HERNIA REPAIR     HIP SURGERY     JOINT REPLACEMENT      Social History: Social History   Tobacco Use   Smoking status: Former    Types: Cigarettes    Quit date: 04/29/1993    Years since quitting: 28.9   Smokeless tobacco: Never  Vaping Use   Vaping Use: Never used  Substance Use Topics   Alcohol use: Yes    Comment: occ   Drug use: No   ***  ROS: Negative for fevers, chills. Positive for***. All other systems reviewed and negative unless stated otherwise in HPI.   Physical Exam:   Vital Signs: There were no vitals taken for this visit. GENERAL: well appearing,in no acute distress,alert SKIN:  Color, texture, turgor normal. No rashes or lesions HEAD:   Normocephalic/atraumatic. CV:  RRR RESP: Normal respiratory effort MSK: no tenderness to palpation over occiput, neck, or shoulders  NEUROLOGICAL: Mental Status: Alert, oriented to person, place and time,Follows commands Cranial Nerves: PERRL, visual fields intact to confrontation, extraocular movements intact, facial sensation intact, no facial droop or ptosis, hearing grossly intact, no dysarthria, palate elevate symmetrically, tongue protrudes midline, shoulder shrug intact and symmetric Motor: muscle strength 5/5 both upper and lower extremities,no drift, normal tone Reflexes: 2+ throughout Sensation: intact to light touch all 4 extremities Coordination: Finger-to- nose-finger intact bilaterally Gait: normal-based   IMPRESSION: ***  PLAN: ***   I spent a total of *** minutes chart reviewing and counseling the patient. Headache education was done. Discussed treatment options including preventive and acute medications, natural supplements, and physical therapy. Discussed medication overuse headache and to limit use of acute treatments to no more than 2 days/week or 10 days/month. Discussed medication side effects, adverse reactions and drug interactions. Written educational materials and patient instructions outlining all of the above were given.  Follow-up: ***   Genia Harold, MD 03/26/2022   10:33 AM

## 2022-04-12 ENCOUNTER — Other Ambulatory Visit (HOSPITAL_COMMUNITY): Payer: Self-pay | Admitting: Internal Medicine

## 2022-04-12 DIAGNOSIS — Z1231 Encounter for screening mammogram for malignant neoplasm of breast: Secondary | ICD-10-CM

## 2022-05-06 ENCOUNTER — Emergency Department (HOSPITAL_COMMUNITY)
Admission: EM | Admit: 2022-05-06 | Discharge: 2022-05-06 | Discharge status: Against Medical Advice | Payer: Medicare HMO | Source: Home / Self Care

## 2022-05-09 ENCOUNTER — Ambulatory Visit: Payer: Medicare HMO | Admitting: Psychiatry

## 2022-05-10 ENCOUNTER — Encounter: Payer: Self-pay | Admitting: Radiology

## 2022-05-21 ENCOUNTER — Ambulatory Visit (HOSPITAL_COMMUNITY)
Admission: RE | Admit: 2022-05-21 | Discharge: 2022-05-21 | Disposition: A | Discharge status: Home / Self Care | Payer: Medicare HMO | Source: Ambulatory Visit | Attending: Internal Medicine | Admitting: Internal Medicine

## 2022-05-21 ENCOUNTER — Encounter (HOSPITAL_COMMUNITY): Payer: Self-pay

## 2022-05-21 DIAGNOSIS — Z1231 Encounter for screening mammogram for malignant neoplasm of breast: Secondary | ICD-10-CM | POA: Insufficient documentation

## 2022-06-12 DIAGNOSIS — M0579 Rheumatoid arthritis with rheumatoid factor of multiple sites without organ or systems involvement: Secondary | ICD-10-CM | POA: Diagnosis not present

## 2022-06-27 DIAGNOSIS — M1991 Primary osteoarthritis, unspecified site: Secondary | ICD-10-CM | POA: Diagnosis not present

## 2022-06-27 DIAGNOSIS — M0579 Rheumatoid arthritis with rheumatoid factor of multiple sites without organ or systems involvement: Secondary | ICD-10-CM | POA: Diagnosis not present

## 2022-06-27 DIAGNOSIS — M25519 Pain in unspecified shoulder: Secondary | ICD-10-CM | POA: Diagnosis not present

## 2022-06-27 DIAGNOSIS — Z79899 Other long term (current) drug therapy: Secondary | ICD-10-CM | POA: Diagnosis not present

## 2022-06-27 DIAGNOSIS — E663 Overweight: Secondary | ICD-10-CM | POA: Diagnosis not present

## 2022-06-27 DIAGNOSIS — Z6828 Body mass index (BMI) 28.0-28.9, adult: Secondary | ICD-10-CM | POA: Diagnosis not present

## 2022-06-27 DIAGNOSIS — S66912D Strain of unspecified muscle, fascia and tendon at wrist and hand level, left hand, subsequent encounter: Secondary | ICD-10-CM | POA: Diagnosis not present

## 2022-07-03 DIAGNOSIS — M25532 Pain in left wrist: Secondary | ICD-10-CM | POA: Diagnosis not present

## 2022-07-25 DIAGNOSIS — S66307A Unspecified injury of extensor muscle, fascia and tendon of left little finger at wrist and hand level, initial encounter: Secondary | ICD-10-CM | POA: Diagnosis not present

## 2022-07-25 DIAGNOSIS — X58XXXA Exposure to other specified factors, initial encounter: Secondary | ICD-10-CM | POA: Diagnosis not present

## 2022-07-25 DIAGNOSIS — S66395A Other injury of extensor muscle, fascia and tendon of left ring finger at wrist and hand level, initial encounter: Secondary | ICD-10-CM | POA: Diagnosis not present

## 2022-07-25 DIAGNOSIS — S66305A Unspecified injury of extensor muscle, fascia and tendon of left ring finger at wrist and hand level, initial encounter: Secondary | ICD-10-CM | POA: Diagnosis not present

## 2022-07-25 DIAGNOSIS — Y999 Unspecified external cause status: Secondary | ICD-10-CM | POA: Diagnosis not present

## 2022-07-25 DIAGNOSIS — S66397A Other injury of extensor muscle, fascia and tendon of left little finger at wrist and hand level, initial encounter: Secondary | ICD-10-CM | POA: Diagnosis not present

## 2022-08-01 DIAGNOSIS — M25532 Pain in left wrist: Secondary | ICD-10-CM | POA: Diagnosis not present

## 2022-08-01 DIAGNOSIS — M25632 Stiffness of left wrist, not elsewhere classified: Secondary | ICD-10-CM | POA: Diagnosis not present

## 2022-08-01 DIAGNOSIS — M79645 Pain in left finger(s): Secondary | ICD-10-CM | POA: Diagnosis not present

## 2022-08-01 DIAGNOSIS — Z4889 Encounter for other specified surgical aftercare: Secondary | ICD-10-CM | POA: Diagnosis not present

## 2022-08-01 DIAGNOSIS — M25642 Stiffness of left hand, not elsewhere classified: Secondary | ICD-10-CM | POA: Diagnosis not present

## 2022-08-08 DIAGNOSIS — H2513 Age-related nuclear cataract, bilateral: Secondary | ICD-10-CM | POA: Diagnosis not present

## 2022-08-08 DIAGNOSIS — M069 Rheumatoid arthritis, unspecified: Secondary | ICD-10-CM | POA: Diagnosis not present

## 2022-08-08 DIAGNOSIS — M25532 Pain in left wrist: Secondary | ICD-10-CM | POA: Diagnosis not present

## 2022-08-08 DIAGNOSIS — H15111 Episcleritis periodica fugax, right eye: Secondary | ICD-10-CM | POA: Diagnosis not present

## 2022-08-15 DIAGNOSIS — E782 Mixed hyperlipidemia: Secondary | ICD-10-CM | POA: Diagnosis not present

## 2022-08-20 DIAGNOSIS — R5383 Other fatigue: Secondary | ICD-10-CM | POA: Diagnosis not present

## 2022-08-20 DIAGNOSIS — Z79899 Other long term (current) drug therapy: Secondary | ICD-10-CM | POA: Diagnosis not present

## 2022-08-20 DIAGNOSIS — E039 Hypothyroidism, unspecified: Secondary | ICD-10-CM | POA: Diagnosis not present

## 2022-08-20 DIAGNOSIS — G43909 Migraine, unspecified, not intractable, without status migrainosus: Secondary | ICD-10-CM | POA: Diagnosis not present

## 2022-08-20 DIAGNOSIS — Z0001 Encounter for general adult medical examination with abnormal findings: Secondary | ICD-10-CM | POA: Diagnosis not present

## 2022-08-20 DIAGNOSIS — E782 Mixed hyperlipidemia: Secondary | ICD-10-CM | POA: Diagnosis not present

## 2022-08-20 DIAGNOSIS — J302 Other seasonal allergic rhinitis: Secondary | ICD-10-CM | POA: Diagnosis not present

## 2022-08-20 DIAGNOSIS — K5904 Chronic idiopathic constipation: Secondary | ICD-10-CM | POA: Diagnosis not present

## 2022-08-20 DIAGNOSIS — I10 Essential (primary) hypertension: Secondary | ICD-10-CM | POA: Diagnosis not present

## 2022-08-23 DIAGNOSIS — M0579 Rheumatoid arthritis with rheumatoid factor of multiple sites without organ or systems involvement: Secondary | ICD-10-CM | POA: Diagnosis not present

## 2022-08-28 DIAGNOSIS — M25532 Pain in left wrist: Secondary | ICD-10-CM | POA: Diagnosis not present

## 2022-09-10 DIAGNOSIS — M25642 Stiffness of left hand, not elsewhere classified: Secondary | ICD-10-CM | POA: Diagnosis not present

## 2022-09-10 DIAGNOSIS — R531 Weakness: Secondary | ICD-10-CM | POA: Diagnosis not present

## 2022-09-10 DIAGNOSIS — M25442 Effusion, left hand: Secondary | ICD-10-CM | POA: Diagnosis not present

## 2022-09-10 DIAGNOSIS — M79642 Pain in left hand: Secondary | ICD-10-CM | POA: Diagnosis not present

## 2022-09-10 DIAGNOSIS — Z4789 Encounter for other orthopedic aftercare: Secondary | ICD-10-CM | POA: Diagnosis not present

## 2022-09-10 DIAGNOSIS — M25432 Effusion, left wrist: Secondary | ICD-10-CM | POA: Diagnosis not present

## 2022-09-14 DIAGNOSIS — M25642 Stiffness of left hand, not elsewhere classified: Secondary | ICD-10-CM | POA: Diagnosis not present

## 2022-09-14 DIAGNOSIS — M79642 Pain in left hand: Secondary | ICD-10-CM | POA: Diagnosis not present

## 2022-09-14 DIAGNOSIS — Z4789 Encounter for other orthopedic aftercare: Secondary | ICD-10-CM | POA: Diagnosis not present

## 2022-09-14 DIAGNOSIS — M25432 Effusion, left wrist: Secondary | ICD-10-CM | POA: Diagnosis not present

## 2022-09-14 DIAGNOSIS — R531 Weakness: Secondary | ICD-10-CM | POA: Diagnosis not present

## 2022-09-14 DIAGNOSIS — M25442 Effusion, left hand: Secondary | ICD-10-CM | POA: Diagnosis not present

## 2022-09-18 DIAGNOSIS — Z4789 Encounter for other orthopedic aftercare: Secondary | ICD-10-CM | POA: Diagnosis not present

## 2022-09-18 DIAGNOSIS — M25642 Stiffness of left hand, not elsewhere classified: Secondary | ICD-10-CM | POA: Diagnosis not present

## 2022-09-18 DIAGNOSIS — M25442 Effusion, left hand: Secondary | ICD-10-CM | POA: Diagnosis not present

## 2022-09-18 DIAGNOSIS — M25432 Effusion, left wrist: Secondary | ICD-10-CM | POA: Diagnosis not present

## 2022-09-18 DIAGNOSIS — R531 Weakness: Secondary | ICD-10-CM | POA: Diagnosis not present

## 2022-09-18 DIAGNOSIS — M79642 Pain in left hand: Secondary | ICD-10-CM | POA: Diagnosis not present

## 2022-09-24 DIAGNOSIS — M25642 Stiffness of left hand, not elsewhere classified: Secondary | ICD-10-CM | POA: Diagnosis not present

## 2022-09-24 DIAGNOSIS — M25432 Effusion, left wrist: Secondary | ICD-10-CM | POA: Diagnosis not present

## 2022-09-24 DIAGNOSIS — M25442 Effusion, left hand: Secondary | ICD-10-CM | POA: Diagnosis not present

## 2022-09-24 DIAGNOSIS — Z4789 Encounter for other orthopedic aftercare: Secondary | ICD-10-CM | POA: Diagnosis not present

## 2022-09-24 DIAGNOSIS — R531 Weakness: Secondary | ICD-10-CM | POA: Diagnosis not present

## 2022-09-24 DIAGNOSIS — M79642 Pain in left hand: Secondary | ICD-10-CM | POA: Diagnosis not present

## 2022-09-26 DIAGNOSIS — M1991 Primary osteoarthritis, unspecified site: Secondary | ICD-10-CM | POA: Diagnosis not present

## 2022-09-26 DIAGNOSIS — Z6827 Body mass index (BMI) 27.0-27.9, adult: Secondary | ICD-10-CM | POA: Diagnosis not present

## 2022-09-26 DIAGNOSIS — E663 Overweight: Secondary | ICD-10-CM | POA: Diagnosis not present

## 2022-09-26 DIAGNOSIS — M7989 Other specified soft tissue disorders: Secondary | ICD-10-CM | POA: Diagnosis not present

## 2022-09-26 DIAGNOSIS — M0579 Rheumatoid arthritis with rheumatoid factor of multiple sites without organ or systems involvement: Secondary | ICD-10-CM | POA: Diagnosis not present

## 2022-09-26 DIAGNOSIS — Z79899 Other long term (current) drug therapy: Secondary | ICD-10-CM | POA: Diagnosis not present

## 2022-10-10 DIAGNOSIS — R531 Weakness: Secondary | ICD-10-CM | POA: Diagnosis not present

## 2022-10-10 DIAGNOSIS — M25432 Effusion, left wrist: Secondary | ICD-10-CM | POA: Diagnosis not present

## 2022-10-10 DIAGNOSIS — M25642 Stiffness of left hand, not elsewhere classified: Secondary | ICD-10-CM | POA: Diagnosis not present

## 2022-10-10 DIAGNOSIS — Z4789 Encounter for other orthopedic aftercare: Secondary | ICD-10-CM | POA: Diagnosis not present

## 2022-10-10 DIAGNOSIS — M25442 Effusion, left hand: Secondary | ICD-10-CM | POA: Diagnosis not present

## 2022-10-10 DIAGNOSIS — M79642 Pain in left hand: Secondary | ICD-10-CM | POA: Diagnosis not present

## 2022-10-26 DIAGNOSIS — M0579 Rheumatoid arthritis with rheumatoid factor of multiple sites without organ or systems involvement: Secondary | ICD-10-CM | POA: Diagnosis not present

## 2022-11-12 ENCOUNTER — Emergency Department (HOSPITAL_COMMUNITY)
Admission: EM | Admit: 2022-11-12 | Discharge: 2022-11-12 | Discharge status: Against Medical Advice | Payer: Medicare HMO | Attending: Emergency Medicine | Admitting: Emergency Medicine

## 2022-11-12 ENCOUNTER — Encounter (HOSPITAL_COMMUNITY): Payer: Self-pay

## 2022-11-12 ENCOUNTER — Other Ambulatory Visit: Payer: Self-pay

## 2022-11-12 DIAGNOSIS — Z5321 Procedure and treatment not carried out due to patient leaving prior to being seen by health care provider: Secondary | ICD-10-CM | POA: Insufficient documentation

## 2022-11-12 DIAGNOSIS — M79606 Pain in leg, unspecified: Secondary | ICD-10-CM | POA: Insufficient documentation

## 2022-11-12 NOTE — ED Triage Notes (Signed)
Pt reports:  Bruises on legs "I think I have blood clots" Unsure when the bruising started Soreness legs When walking Started Saturday

## 2022-11-13 DIAGNOSIS — L819 Disorder of pigmentation, unspecified: Secondary | ICD-10-CM | POA: Diagnosis not present

## 2022-11-13 DIAGNOSIS — R233 Spontaneous ecchymoses: Secondary | ICD-10-CM | POA: Diagnosis not present

## 2022-11-26 DIAGNOSIS — R945 Abnormal results of liver function studies: Secondary | ICD-10-CM | POA: Diagnosis not present

## 2022-12-19 DIAGNOSIS — R5383 Other fatigue: Secondary | ICD-10-CM | POA: Diagnosis not present

## 2022-12-19 DIAGNOSIS — E782 Mixed hyperlipidemia: Secondary | ICD-10-CM | POA: Diagnosis not present

## 2022-12-19 DIAGNOSIS — E039 Hypothyroidism, unspecified: Secondary | ICD-10-CM | POA: Diagnosis not present

## 2022-12-27 DIAGNOSIS — I10 Essential (primary) hypertension: Secondary | ICD-10-CM | POA: Diagnosis not present

## 2022-12-27 DIAGNOSIS — E782 Mixed hyperlipidemia: Secondary | ICD-10-CM | POA: Diagnosis not present

## 2022-12-27 DIAGNOSIS — J302 Other seasonal allergic rhinitis: Secondary | ICD-10-CM | POA: Diagnosis not present

## 2022-12-27 DIAGNOSIS — G43909 Migraine, unspecified, not intractable, without status migrainosus: Secondary | ICD-10-CM | POA: Diagnosis not present

## 2022-12-27 DIAGNOSIS — R5383 Other fatigue: Secondary | ICD-10-CM | POA: Diagnosis not present

## 2022-12-27 DIAGNOSIS — E039 Hypothyroidism, unspecified: Secondary | ICD-10-CM | POA: Diagnosis not present

## 2022-12-27 DIAGNOSIS — M069 Rheumatoid arthritis, unspecified: Secondary | ICD-10-CM | POA: Diagnosis not present

## 2022-12-27 DIAGNOSIS — Z23 Encounter for immunization: Secondary | ICD-10-CM | POA: Diagnosis not present

## 2022-12-27 DIAGNOSIS — K5904 Chronic idiopathic constipation: Secondary | ICD-10-CM | POA: Diagnosis not present

## 2023-01-03 DIAGNOSIS — E663 Overweight: Secondary | ICD-10-CM | POA: Diagnosis not present

## 2023-01-03 DIAGNOSIS — M1991 Primary osteoarthritis, unspecified site: Secondary | ICD-10-CM | POA: Diagnosis not present

## 2023-01-03 DIAGNOSIS — Z79899 Other long term (current) drug therapy: Secondary | ICD-10-CM | POA: Diagnosis not present

## 2023-01-03 DIAGNOSIS — Z6827 Body mass index (BMI) 27.0-27.9, adult: Secondary | ICD-10-CM | POA: Diagnosis not present

## 2023-01-03 DIAGNOSIS — M0579 Rheumatoid arthritis with rheumatoid factor of multiple sites without organ or systems involvement: Secondary | ICD-10-CM | POA: Diagnosis not present

## 2023-01-03 DIAGNOSIS — R7989 Other specified abnormal findings of blood chemistry: Secondary | ICD-10-CM | POA: Diagnosis not present

## 2023-01-10 ENCOUNTER — Other Ambulatory Visit (HOSPITAL_COMMUNITY): Payer: Self-pay | Admitting: Ophthalmology

## 2023-01-10 DIAGNOSIS — H2513 Age-related nuclear cataract, bilateral: Secondary | ICD-10-CM | POA: Diagnosis not present

## 2023-01-10 DIAGNOSIS — H4922 Sixth [abducent] nerve palsy, left eye: Secondary | ICD-10-CM

## 2023-01-10 DIAGNOSIS — H01001 Unspecified blepharitis right upper eyelid: Secondary | ICD-10-CM | POA: Diagnosis not present

## 2023-01-10 DIAGNOSIS — H01002 Unspecified blepharitis right lower eyelid: Secondary | ICD-10-CM | POA: Diagnosis not present

## 2023-01-10 DIAGNOSIS — H01004 Unspecified blepharitis left upper eyelid: Secondary | ICD-10-CM | POA: Diagnosis not present

## 2023-01-14 DIAGNOSIS — H40013 Open angle with borderline findings, low risk, bilateral: Secondary | ICD-10-CM | POA: Diagnosis not present

## 2023-01-14 DIAGNOSIS — H25813 Combined forms of age-related cataract, bilateral: Secondary | ICD-10-CM | POA: Diagnosis not present

## 2023-01-16 ENCOUNTER — Encounter (HOSPITAL_COMMUNITY): Payer: Self-pay

## 2023-01-16 ENCOUNTER — Ambulatory Visit (HOSPITAL_COMMUNITY): Payer: Medicare HMO

## 2023-01-21 DIAGNOSIS — J019 Acute sinusitis, unspecified: Secondary | ICD-10-CM | POA: Diagnosis not present

## 2023-01-21 DIAGNOSIS — Z87891 Personal history of nicotine dependence: Secondary | ICD-10-CM | POA: Diagnosis not present

## 2023-01-22 ENCOUNTER — Ambulatory Visit (HOSPITAL_COMMUNITY)
Admission: RE | Admit: 2023-01-22 | Discharge: 2023-01-22 | Disposition: A | Discharge status: Home / Self Care | Payer: Medicare HMO | Source: Ambulatory Visit | Attending: Ophthalmology | Admitting: Ophthalmology

## 2023-01-22 DIAGNOSIS — H4922 Sixth [abducent] nerve palsy, left eye: Secondary | ICD-10-CM | POA: Insufficient documentation

## 2023-01-22 MED ORDER — GADOBUTROL 1 MMOL/ML IV SOLN
7.0000 mL | Freq: Once | INTRAVENOUS | Status: AC | PRN
  Administered 2023-01-22: 7 mL via INTRAVENOUS

## 2023-02-25 DIAGNOSIS — H25812 Combined forms of age-related cataract, left eye: Secondary | ICD-10-CM | POA: Diagnosis not present

## 2023-02-28 ENCOUNTER — Encounter (HOSPITAL_COMMUNITY)
Admission: RE | Admit: 2023-02-28 | Discharge: 2023-02-28 | Disposition: A | Discharge status: Home / Self Care | Payer: Medicare HMO | Source: Ambulatory Visit | Attending: Ophthalmology | Admitting: Ophthalmology

## 2023-02-28 ENCOUNTER — Other Ambulatory Visit: Payer: Self-pay

## 2023-02-28 ENCOUNTER — Encounter (HOSPITAL_COMMUNITY): Payer: Self-pay

## 2023-02-28 HISTORY — DX: Hypothyroidism, unspecified: E03.9

## 2023-02-28 HISTORY — DX: Unspecified osteoarthritis, unspecified site: M19.90

## 2023-02-28 NOTE — H&P (Signed)
Surgical History & Physical  Patient Name: Rhonda [name]  DOB: [birth year]  Surgery: Cataract extraction with intraocular lens implant phacoemulsification; Left Eye Surgeon: Fabio Pierce MD Surgery Date: 03/04/2023 Pre-Op Date: 01/14/2023  HPI: A 26 Yr. old female patient 1. The patient is returning for cataract eval- OU. Pt. complains of difficulty when recognizing people. Both eyes are affected. The episode is constant. Symptoms occur when the patient is driving. Distance vision is worse than near. Pt. is ready to proceed with cataract surgery. HPI was performed by Fabio Pierce .  Medical History:  Arthritis Motion sickness Thyroid Problems  Review of Systems Negsgative Allergic/Immunologic Negative Cardiovascular Negative Constitutional Negative Ear, Nose, Mouth & Throat Thyroid problems Endocrine Negative Eyes Negative Gastrointestinal Negative Genitourinary Negative Hemotologic/Lymphatic Negative Integumentary Arthritis Musculoskeletal Negative Neurological Negative Psychiatry Negative Respiratory  Social Never smoked   Medication OTC Artificial Tears,  Leflunomide, Metropol, Levothyroxine, Zoloft,  ondansetron ,  leflunomide ,  levothyroxine ,  folic acid ,  metoprolol tartrate ,  methotrexate sodium ,  Nurtec ODT   Sx/Procedures Hand Surgery, Hip Replacement, Hysterectomy  Drug Allergies  demerol ,  symicort ,  oxycodone ,  hydrocodone ,  daypro ,  aspirin ,  tetracycline ,  tetanus toxoid, adsorbed ,  Sulfa (Sulfonamide Antibiotics) ,  Shellfish Containing Products ,  Betagen (povidone-iodine) ,  peanut ,  Lortab ,  latex ,  Allerfrim with Codeine   History & Physical: Heent: cataracts  NECK: supple without bruits LUNGS: lungs clear to auscultation CV: regular rate and rhythm Abdomen: soft and non-tender  Impression & Plan: Assessment: 1.  COMBINED FORMS AGE RELATED CATARACT; Both Eyes (H25.813) 2.  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  Rheumatoid Arthritis (M06.9) 5.  BLEPHARITIS; Right Upper Lid, Right Lower Lid, Left Upper Lid, Left Lower Lid (H01.001, H01.002,H01.004,H01.005) 6.  ARCUS SENILIS; Both Eyes (H18.413) 7.  ASTIGMATISM, REGULAR; Both Eyes (H52.223)  Plan: 1.  Cataract accounts for the patient's decreased vision. This visual impairment is not correctable with a tolerable change in glasses or contact lenses. Cataract surgery with an implantation of a new lens should significantly improve the visual and functional status of the patient. Discussed all risks, benefits, alternatives, and potential complications. Discussed the procedures and recovery. Patient desires to have surgery. A-scan ordered and performed today for intra-ocular lens calculations. The surgery will be performed in order to improve vision for driving, reading, and for eye examinations. Recommend phacoemulsification with intra-ocular lens. Recommend Dextenza for post-operative pain and inflammation. Left Eye. worse - first Dilates well - shugarcaine by protocol. Vision [employer]. Recommend Toric Lens.  2.  Based on cup-to-disc ratio. Negative Family history. OCT rNFL shows: thinning OU 01/14/23] IOP elevated OD today - recommend cataract surgery to help lower IOP.  3.  Chronic, worsening. Recommend MRI of brain and orbits with and without contrast - scheduled for 2 days from now. May require muscle surgery. Will want to perform cataract surgery first.  4.  As above  5.  Blepharitis is present - recommend regular lid cleaning.  6.  Discussed significance of finding Answered patient questions about finding  7.  Recommend toric IOL OU.

## 2023-03-04 ENCOUNTER — Ambulatory Visit (HOSPITAL_COMMUNITY)
Admission: RE | Admit: 2023-03-04 | Discharge: 2023-03-04 | Disposition: A | Discharge status: Home / Self Care | Payer: Medicare HMO | Attending: Ophthalmology | Admitting: Ophthalmology

## 2023-03-04 ENCOUNTER — Ambulatory Visit (HOSPITAL_COMMUNITY): Payer: Medicare HMO | Admitting: Anesthesiology

## 2023-03-04 ENCOUNTER — Encounter (HOSPITAL_COMMUNITY): Payer: Self-pay | Admitting: Ophthalmology

## 2023-03-04 ENCOUNTER — Encounter (HOSPITAL_COMMUNITY)
Admission: RE | Disposition: A | Discharge status: Home / Self Care | Payer: Self-pay | Source: Home / Self Care | Attending: Ophthalmology

## 2023-03-04 ENCOUNTER — Other Ambulatory Visit: Payer: Self-pay

## 2023-03-04 DIAGNOSIS — H4922 Sixth [abducent] nerve palsy, left eye: Secondary | ICD-10-CM | POA: Insufficient documentation

## 2023-03-04 DIAGNOSIS — H40013 Open angle with borderline findings, low risk, bilateral: Secondary | ICD-10-CM | POA: Insufficient documentation

## 2023-03-04 DIAGNOSIS — H18413 Arcus senilis, bilateral: Secondary | ICD-10-CM | POA: Diagnosis not present

## 2023-03-04 DIAGNOSIS — E039 Hypothyroidism, unspecified: Secondary | ICD-10-CM | POA: Insufficient documentation

## 2023-03-04 DIAGNOSIS — H25812 Combined forms of age-related cataract, left eye: Secondary | ICD-10-CM | POA: Insufficient documentation

## 2023-03-04 DIAGNOSIS — M069 Rheumatoid arthritis, unspecified: Secondary | ICD-10-CM | POA: Insufficient documentation

## 2023-03-04 DIAGNOSIS — H52223 Regular astigmatism, bilateral: Secondary | ICD-10-CM | POA: Insufficient documentation

## 2023-03-04 DIAGNOSIS — H0100B Unspecified blepharitis left eye, upper and lower eyelids: Secondary | ICD-10-CM | POA: Insufficient documentation

## 2023-03-04 DIAGNOSIS — I10 Essential (primary) hypertension: Secondary | ICD-10-CM | POA: Diagnosis not present

## 2023-03-04 DIAGNOSIS — Z87891 Personal history of nicotine dependence: Secondary | ICD-10-CM | POA: Insufficient documentation

## 2023-03-04 DIAGNOSIS — H0100A Unspecified blepharitis right eye, upper and lower eyelids: Secondary | ICD-10-CM | POA: Insufficient documentation

## 2023-03-04 HISTORY — PX: CATARACT EXTRACTION W/PHACO: SHX586

## 2023-03-04 SURGERY — PHACOEMULSIFICATION, CATARACT, WITH IOL INSERTION
Anesthesia: Monitor Anesthesia Care | Site: Eye | Laterality: Left

## 2023-03-04 MED ORDER — PHENYLEPHRINE HCL 2.5 % OP SOLN
1.0000 [drp] | OPHTHALMIC | Status: AC | PRN
  Administered 2023-03-04 (×3): 1 [drp] via OPHTHALMIC

## 2023-03-04 MED ORDER — LIDOCAINE HCL 3.5 % OP GEL
1.0000 | Freq: Once | OPHTHALMIC | Status: AC
  Administered 2023-03-04: 1 via OPHTHALMIC

## 2023-03-04 MED ORDER — STERILE WATER FOR IRRIGATION IR SOLN
Status: DC | PRN
  Administered 2023-03-04: 250 mL

## 2023-03-04 MED ORDER — MIDAZOLAM HCL 2 MG/2ML IJ SOLN
INTRAMUSCULAR | Status: AC
  Filled 2023-03-04: qty 2

## 2023-03-04 MED ORDER — TETRACAINE HCL 0.5 % OP SOLN
1.0000 [drp] | OPHTHALMIC | Status: AC | PRN
  Administered 2023-03-04 (×3): 1 [drp] via OPHTHALMIC

## 2023-03-04 MED ORDER — SODIUM HYALURONATE 10 MG/ML IO SOLUTION
PREFILLED_SYRINGE | INTRAOCULAR | Status: DC | PRN
  Administered 2023-03-04: .85 mL via INTRAOCULAR

## 2023-03-04 MED ORDER — MOXIFLOXACIN HCL 5 MG/ML IO SOLN
INTRAOCULAR | Status: DC | PRN
  Administered 2023-03-04: .3 mL via OPHTHALMIC

## 2023-03-04 MED ORDER — MIDAZOLAM HCL 2 MG/2ML IJ SOLN
INTRAMUSCULAR | Status: DC | PRN
  Administered 2023-03-04: 1 mg via INTRAVENOUS

## 2023-03-04 MED ORDER — SODIUM HYALURONATE 23MG/ML IO SOSY
PREFILLED_SYRINGE | INTRAOCULAR | Status: DC | PRN
  Administered 2023-03-04: .6 mL via INTRAOCULAR

## 2023-03-04 MED ORDER — TROPICAMIDE 1 % OP SOLN
1.0000 [drp] | OPHTHALMIC | Status: AC | PRN
  Administered 2023-03-04 (×3): 1 [drp] via OPHTHALMIC

## 2023-03-04 MED ORDER — EPINEPHRINE PF 1 MG/ML IJ SOLN
INTRAOCULAR | Status: DC | PRN
  Administered 2023-03-04: 500 mL

## 2023-03-04 MED ORDER — POVIDONE-IODINE 5 % OP SOLN
OPHTHALMIC | Status: DC | PRN
  Administered 2023-03-04: 1 via OPHTHALMIC

## 2023-03-04 MED ORDER — LIDOCAINE HCL (PF) 1 % IJ SOLN
INTRAOCULAR | Status: DC | PRN
  Administered 2023-03-04: 1 mL via OPHTHALMIC

## 2023-03-04 MED ORDER — SODIUM CHLORIDE 0.9% FLUSH
INTRAVENOUS | Status: DC | PRN
Start: 2023-03-04 — End: 2023-03-04
  Administered 2023-03-04: 5 mL via INTRAVENOUS

## 2023-03-04 MED ORDER — BSS IO SOLN
INTRAOCULAR | Status: DC | PRN
  Administered 2023-03-04: 15 mL via INTRAOCULAR

## 2023-03-04 SURGICAL SUPPLY — 13 items
CATARACT SUITE SIGHTPATH (MISCELLANEOUS) ×1
CLOTH BEACON ORANGE TIMEOUT ST (SAFETY) ×1 IMPLANT
EYE SHIELD UNIVERSAL CLEAR (GAUZE/BANDAGES/DRESSINGS) IMPLANT
FEE CATARACT SUITE SIGHTPATH (MISCELLANEOUS) ×1 IMPLANT
GLOVE BIOGEL PI IND STRL 7.0 (GLOVE) ×2 IMPLANT
LENS IOL TECNIS EYHANCE 11.0 (Intraocular Lens) IMPLANT
NDL HYPO 18GX1.5 BLUNT FILL (NEEDLE) ×1 IMPLANT
NEEDLE HYPO 18GX1.5 BLUNT FILL (NEEDLE) ×1
PAD ARMBOARD 7.5X6 YLW CONV (MISCELLANEOUS) ×1 IMPLANT
POSITIONER HEAD 8X9X4 ADT (SOFTGOODS) ×1 IMPLANT
SYR TB 1ML LL NO SAFETY (SYRINGE) ×1 IMPLANT
TAPE SURG TRANSPORE 1 IN (GAUZE/BANDAGES/DRESSINGS) IMPLANT
WATER STERILE IRR 250ML POUR (IV SOLUTION) ×1 IMPLANT

## 2023-03-04 NOTE — Discharge Instructions (Signed)
Please discharge patient when stable, will follow up today with Dr. Carnella Fryman at the [facility] office immediately following discharge.  Leave shield in place until visit.  All paperwork with discharge instructions will be given at the office.  [facility] Address:  [address]  [place], [place] [address]  

## 2023-03-04 NOTE — Transfer of Care (Addendum)
Immediate Anesthesia Transfer of Care Note  Patient: Rhonda [name]  Procedure(s) Performed: CATARACT EXTRACTION PHACO AND INTRAOCULAR LENS PLACEMENT (IOC) (Left: Eye)  Patient Location: Short Stay  Anesthesia Type:MAC  Level of Consciousness: awake and patient cooperative  Airway & Oxygen Therapy: Patient Spontanous Breathing  Post-op Assessment: Report given to RN and Post -op Vital signs reviewed and stable  Post vital signs: Reviewed and stable  Last Vitals:  Vitals Value Taken Time  BP    Temp 37.1 03/04/23   0924  Pulse 66 03/04/23   0924  Resp 18 03/04/23   0924  SpO2      Last Pain:  Vitals:   03/04/23 0800  TempSrc: Oral  PainSc: 0-No pain         Complications: No notable events documented.

## 2023-03-04 NOTE — Anesthesia Preprocedure Evaluation (Signed)
Anesthesia Evaluation  Patient identified by MRN, date of birth, ID band Patient awake    Reviewed: Allergy & Precautions, H&P , NPO status , Patient's Chart, lab work & pertinent test results, reviewed documented beta blocker date and time   Airway Mallampati: II  TM Distance: >3 FB Neck ROM: full    Dental no notable dental hx.    Pulmonary neg pulmonary ROS, former smoker   Pulmonary exam normal breath sounds clear to auscultation       Cardiovascular Exercise Tolerance: Good hypertension,  Rhythm:regular Rate:Normal     Neuro/Psych  Headaches PSYCHIATRIC DISORDERS         GI/Hepatic negative GI ROS, Neg liver ROS,,,  Endo/Other  Hypothyroidism    Renal/GU negative Renal ROS  negative genitourinary   Musculoskeletal   Abdominal   Peds  Hematology  (+) Blood dyscrasia, anemia   Anesthesia Other Findings   Reproductive/Obstetrics negative OB ROS                             Anesthesia Physical Anesthesia Plan  ASA: 2  Anesthesia Plan:    Post-op Pain Management:    Induction:   PONV Risk Score and Plan:   Airway Management Planned:   Additional Equipment:   Intra-op Plan:   Post-operative Plan:   Informed Consent: I have reviewed the patients History and Physical, chart, labs and discussed the procedure including the risks, benefits and alternatives for the proposed anesthesia with the patient or authorized representative who has indicated his/her understanding and acceptance.     Dental Advisory Given  Plan Discussed with: CRNA  Anesthesia Plan Comments:        Anesthesia Quick Evaluation

## 2023-03-04 NOTE — Op Note (Signed)
Date of procedure: 03/04/23  Pre-operative diagnosis: Visually significant age-related combined cataract, Left Eye (H25.812)  Post-operative diagnosis: Visually significant age-related combined cataract, Left Eye (H25.812)  Procedure: Removal of cataract via phacoemulsification and insertion of intra-ocular lens Johnson [employer] Johnson DIB00 +11.0D into the capsular bag of the Left Eye  Attending surgeon: Rudy Jew. Traveion Ruddock, MD, MA  Anesthesia: MAC, Topical Akten  Complications: None  Estimated Blood Loss: <11mL (minimal)  Specimens: None  Implants: As above  Indications:  Visually significant age-related cataract, Left Eye  Procedure:  The patient was seen and identified in the pre-operative area. The operative eye was identified and dilated.  The operative eye was marked.  Topical anesthesia was administered to the operative eye.     The patient was then to the operative suite and placed in the supine position.  A timeout was performed confirming the patient, procedure to be performed, and all other relevant information.   The patient's face was prepped and draped in the usual fashion for intra-ocular surgery.  A lid speculum was placed into the operative eye and the surgical microscope moved into place and focused.  An inferotemporal paracentesis was created using a 20 gauge paracentesis blade.  Shugarcaine was injected into the anterior chamber.  Viscoelastic was injected into the anterior chamber.  A temporal clear-corneal main wound incision was created using a 2.22mm microkeratome.  A continuous curvilinear capsulorrhexis was initiated using an irrigating cystitome and completed using capsulorrhexis forceps.  Hydrodissection and hydrodeliniation were performed.  Viscoelastic was injected into the anterior chamber.  A phacoemulsification handpiece and a chopper as a second instrument were used to remove the nucleus and epinucleus. The irrigation/aspiration handpiece was used to remove any  remaining cortical material.   The capsular bag was reinflated with viscoelastic, checked, and found to be intact.  The intraocular lens was inserted into the capsular bag.  The irrigation/aspiration handpiece was used to remove any remaining viscoelastic.  The clear corneal wound and paracentesis wounds were then hydrated and checked with Weck-Cels to be watertight. 0.26mL of Moxfloxacin was injected into the anterior chamber. The lid-speculum was removed.  The drape was removed.  The patient's face was cleaned with a wet and dry 4x4.    A clear shield was taped over the eye. The patient was taken to the post-operative care unit in good condition, having tolerated the procedure well.  Post-Op Instructions: The patient will follow up at [facility] for a same day post-operative evaluation and will receive all other orders and instructions.

## 2023-03-04 NOTE — Interval H&P Note (Signed)
History and Physical Interval Note:  03/04/2023 8:56 AM  Rhonda [name]  has presented today for surgery, with the diagnosis of combined forms age related cataract, left eye.  The various methods of treatment have been discussed with the patient and family. After consideration of risks, benefits and other options for treatment, the patient has consented to  Procedure(s): CATARACT EXTRACTION PHACO AND INTRAOCULAR LENS PLACEMENT (IOC) (Left) as a surgical intervention.  The patient's history has been reviewed, patient examined, no change in status, stable for surgery.  I have reviewed the patient's chart and labs.  Questions were answered to the patient's satisfaction.     Fabio Pierce

## 2023-03-04 NOTE — Anesthesia Postprocedure Evaluation (Signed)
Anesthesia Post Note  Patient: Rhonda [name]  Procedure(s) Performed: CATARACT EXTRACTION PHACO AND INTRAOCULAR LENS PLACEMENT (IOC) (Left: Eye)  Anesthetic complications: no  No notable events documented.   Last Vitals:  Vitals:   03/04/23 0800 03/04/23 0924  BP: (!) 154/77   Pulse: 62 66  Resp: 16 18  Temp: 36.8 C 37.1 C  SpO2: 99%     Last Pain:  Vitals:   03/04/23 0924  TempSrc: Oral  PainSc: 0-No pain                 Windell Norfolk

## 2023-03-08 ENCOUNTER — Encounter (HOSPITAL_COMMUNITY): Payer: Self-pay | Admitting: Ophthalmology

## 2023-03-11 DIAGNOSIS — H25811 Combined forms of age-related cataract, right eye: Secondary | ICD-10-CM | POA: Diagnosis not present

## 2023-03-14 ENCOUNTER — Inpatient Hospital Stay (HOSPITAL_COMMUNITY)
Admission: RE | Admit: 2023-03-14 | Discharge: 2023-03-14 | Disposition: A | Discharge status: Home / Self Care | Payer: Medicare HMO | Source: Ambulatory Visit

## 2023-03-14 ENCOUNTER — Encounter (HOSPITAL_COMMUNITY): Payer: Self-pay

## 2023-03-14 NOTE — H&P (Signed)
 Surgical History & Physical  Patient Name: Rhonda [name]  DOB: 1950/09/19  Surgery: Cataract extraction with intraocular lens implant phacoemulsification; Right Eye Surgeon: Lynwood Hermann MD Surgery Date: 03/15/2023 Pre-Op Date: 03/11/2023  HPI: A 57 Yr. old female patient 1. The patient is returning after cataract surgery. The left eye is affected. Status post cataract surgery, which began 1 week ago: Since the last visit, the affected area is doing well. The patient's vision is improved. Patient also presents for blurry vision in the right eye. This has been present for several months. The condition's severity is constant. Patient is following medication instructions. Patient is having trouble reading her mail. She is scheduled to have od done 03/25/23. This is negatively affecting the patient's quality of life and the patient is unable to function adequately in life with the current level of vision.HPI was performed by Lynwood Hermann .  Medical History: Cataracts  Arthritis Motion sickness Thyroid  Problems  Review of Systems Negative Allergic/Immunologic Negative Cardiovascular Negative Constitutional Negative Ear, Nose, Mouth & Throat Thyroid  disease Endocrine Negative Eyes Negative Gastrointestinal Negative Genitourinary Negative Hemotologic/Lymphatic Negative Integumentary Arthritis pains Musculoskeletal Negative Neurological Negative Psychiatry Negative Respiratory  Social Never smoked   Medication OTC Artificial Tears, Prednisolone-moxiflox-bromfen,  Leflunomide, Metropol, Levothyroxine, Zoloft,  ondansetron  ,  leflunomide ,  levothyroxine ,  folic acid  ,  metoprolol tartrate ,  methotrexate  sodium ,  Nurtec ODT  Sx/Procedures Cat Sx OS,  Hand Surgery, Hip Replacement, Hysterectomy  Drug Allergies  demerol ,  symicort ,  oxycodone  ,  hydrocodone  ,  daypro ,  aspirin ,  tetracycline ,  tetanus toxoid, adsorbed ,  Sulfa (Sulfonamide Antibiotics) ,  Shellfish  Containing Products ,  Betagen (povidone-iodine ) ,  peanut ,  Lortab ,  latex ,  Allerfrim with Codeine   History & Physical: Heent: cataract NECK: supple without bruits LUNGS: lungs clear to auscultation CV: regular rate and rhythm Abdomen: soft and non-tender  Impression & Plan: Assessment: 1.  CATARACT EXTRACTION STATUS; Left Eye (Z98.42) 2.  COMBINED FORMS AGE RELATED CATARACT; Right Eye (H25.811) 3.  INTRAOCULAR LENS IOL ; Left Eye (Z96.1)  Plan: 1.  1 week after cataract surgery. Doing well with improved vision and normal eye pressure. Call with any problems or concerns. Continue Pred-Moxi-Brom 2x/day for 3 more weeks.  2.  Cataract accounts for the patient's decreased vision. This visual impairment is not correctable with a tolerable change in glasses or contact lenses. Cataract surgery with an implantation of a new lens should significantly improve the visual and functional status of the patient. Discussed all risks, benefits, alternatives, and potential complications. Discussed the procedures and recovery. Patient desires to have surgery. A-scan ordered and performed today for intra-ocular lens calculations. The surgery will be performed in order to improve vision for driving, reading, and for eye examinations. Recommend phacoemulsification with intra-ocular lens. Recommend Dextenza for post-operative pain and inflammation. Right Eye. Surgery required to correct imbalance of vision. Dilates well - shugarcaine by protocol.  3.  Doing well since surgery

## 2023-03-15 ENCOUNTER — Encounter (HOSPITAL_COMMUNITY)
Admission: RE | Disposition: A | Discharge status: Home / Self Care | Payer: Self-pay | Source: Home / Self Care | Attending: Ophthalmology

## 2023-03-15 ENCOUNTER — Ambulatory Visit (HOSPITAL_COMMUNITY)
Admission: RE | Admit: 2023-03-15 | Discharge: 2023-03-15 | Disposition: A | Discharge status: Home / Self Care | Payer: Medicare HMO | Attending: Ophthalmology | Admitting: Ophthalmology

## 2023-03-15 ENCOUNTER — Ambulatory Visit (HOSPITAL_COMMUNITY): Payer: Medicare HMO | Admitting: Certified Registered"

## 2023-03-15 DIAGNOSIS — H25811 Combined forms of age-related cataract, right eye: Secondary | ICD-10-CM | POA: Diagnosis not present

## 2023-03-15 DIAGNOSIS — Z9842 Cataract extraction status, left eye: Secondary | ICD-10-CM | POA: Insufficient documentation

## 2023-03-15 DIAGNOSIS — Z961 Presence of intraocular lens: Secondary | ICD-10-CM | POA: Diagnosis not present

## 2023-03-15 DIAGNOSIS — I10 Essential (primary) hypertension: Secondary | ICD-10-CM | POA: Insufficient documentation

## 2023-03-15 DIAGNOSIS — E039 Hypothyroidism, unspecified: Secondary | ICD-10-CM | POA: Insufficient documentation

## 2023-03-15 HISTORY — PX: CATARACT EXTRACTION W/PHACO: SHX586

## 2023-03-15 SURGERY — PHACOEMULSIFICATION, CATARACT, WITH IOL INSERTION
Anesthesia: Monitor Anesthesia Care | Site: Eye | Laterality: Right

## 2023-03-15 MED ORDER — LIDOCAINE HCL 3.5 % OP GEL
1.0000 | Freq: Once | OPHTHALMIC | Status: AC
  Administered 2023-03-15: 1 via OPHTHALMIC

## 2023-03-15 MED ORDER — SODIUM CHLORIDE 0.9% FLUSH: 3.0000 mL | INTRAVENOUS | Status: DC | PRN

## 2023-03-15 MED ORDER — SODIUM CHLORIDE 0.9% FLUSH
3.0000 mL | Freq: Two times a day (BID) | INTRAVENOUS | Status: DC
  Administered 2023-03-15: 8 mL via INTRAVENOUS

## 2023-03-15 MED ORDER — TETRACAINE HCL 0.5 % OP SOLN
1.0000 [drp] | OPHTHALMIC | Status: AC | PRN
Start: 2023-03-15 — End: 2023-03-15
  Administered 2023-03-15 (×3): 1 [drp] via OPHTHALMIC

## 2023-03-15 MED ORDER — SODIUM HYALURONATE 23MG/ML IO SOSY
PREFILLED_SYRINGE | INTRAOCULAR | Status: DC | PRN
  Administered 2023-03-15: .6 mL via INTRAOCULAR

## 2023-03-15 MED ORDER — MOXIFLOXACIN HCL 5 MG/ML IO SOLN
INTRAOCULAR | Status: DC | PRN
  Administered 2023-03-15: .2 mL via INTRACAMERAL

## 2023-03-15 MED ORDER — TROPICAMIDE 1 % OP SOLN
1.0000 [drp] | OPHTHALMIC | Status: AC | PRN
Start: 2023-03-15 — End: 2023-03-15
  Administered 2023-03-15 (×3): 1 [drp] via OPHTHALMIC

## 2023-03-15 MED ORDER — LIDOCAINE HCL (PF) 1 % IJ SOLN
INTRAOCULAR | Status: DC | PRN
  Administered 2023-03-15: 1 mL via OPHTHALMIC

## 2023-03-15 MED ORDER — PHENYLEPHRINE HCL 2.5 % OP SOLN
1.0000 [drp] | OPHTHALMIC | Status: AC | PRN
Start: 2023-03-15 — End: 2023-03-15
  Administered 2023-03-15 (×3): 1 [drp] via OPHTHALMIC

## 2023-03-15 MED ORDER — STERILE WATER FOR IRRIGATION IR SOLN
Status: DC | PRN
  Administered 2023-03-15: 1

## 2023-03-15 MED ORDER — MIDAZOLAM HCL 2 MG/2ML IJ SOLN
INTRAMUSCULAR | Status: AC
  Filled 2023-03-15: qty 2

## 2023-03-15 MED ORDER — MIDAZOLAM HCL 2 MG/2ML IJ SOLN
INTRAMUSCULAR | Status: DC | PRN
Start: 2023-03-15 — End: 2023-03-15
  Administered 2023-03-15: 1 mg via INTRAVENOUS

## 2023-03-15 MED ORDER — BSS IO SOLN
INTRAOCULAR | Status: DC | PRN
  Administered 2023-03-15: 15 mL via INTRAOCULAR

## 2023-03-15 MED ORDER — SODIUM HYALURONATE 10 MG/ML IO SOLUTION
PREFILLED_SYRINGE | INTRAOCULAR | Status: DC | PRN
  Administered 2023-03-15: .85 mL via INTRAOCULAR

## 2023-03-15 MED ORDER — EPINEPHRINE PF 1 MG/ML IJ SOLN
INTRAOCULAR | Status: DC | PRN
  Administered 2023-03-15: 500 mL

## 2023-03-15 MED ORDER — POVIDONE-IODINE 5 % OP SOLN
OPHTHALMIC | Status: DC | PRN
  Administered 2023-03-15: 1 via OPHTHALMIC

## 2023-03-15 SURGICAL SUPPLY — 12 items
CATARACT SUITE SIGHTPATH (MISCELLANEOUS) ×1
CLOTH BEACON ORANGE TIMEOUT ST (SAFETY) ×1 IMPLANT
EYE SHIELD UNIVERSAL CLEAR (GAUZE/BANDAGES/DRESSINGS) IMPLANT
FEE CATARACT SUITE SIGHTPATH (MISCELLANEOUS) ×1 IMPLANT
GLOVE BIOGEL PI IND STRL 7.0 (GLOVE) ×2 IMPLANT
LENS IOL TECNIS EYHANCE 12.0 (Intraocular Lens) IMPLANT
NDL HYPO 18GX1.5 BLUNT FILL (NEEDLE) ×1 IMPLANT
NEEDLE HYPO 18GX1.5 BLUNT FILL (NEEDLE) ×1
PAD ARMBOARD 7.5X6 YLW CONV (MISCELLANEOUS) ×1 IMPLANT
SYR TB 1ML LL NO SAFETY (SYRINGE) ×1 IMPLANT
TAPE SURG TRANSPORE 1 IN (GAUZE/BANDAGES/DRESSINGS) IMPLANT
WATER STERILE IRR 250ML POUR (IV SOLUTION) ×1 IMPLANT

## 2023-03-15 NOTE — Transfer of Care (Signed)
 Immediate Anesthesia Transfer of Care Note  Patient: Rhonda [name]  Procedure(s) Performed: CATARACT EXTRACTION PHACO AND INTRAOCULAR LENS PLACEMENT (IOC) (Right: Eye)  Patient Location: Short Stay  Anesthesia Type:MAC  Level of Consciousness: awake, alert , and oriented  Airway & Oxygen Therapy: Patient Spontanous Breathing  Post-op Assessment: Report given to RN and Post -op Vital signs reviewed and stable  Post vital signs: Reviewed and stable  Last Vitals:  Vitals Value Taken Time  BP    Temp    Pulse    Resp    SpO2      Last Pain:  Vitals:   03/15/23 1224  TempSrc: Oral  PainSc: 0-No pain      Patients Stated Pain Goal: 5 (03/15/23 1224)  Complications: No notable events documented.

## 2023-03-15 NOTE — Anesthesia Procedure Notes (Signed)
 Procedure Name: MAC Date/Time: 03/15/2023 12:59 PM  Performed by: Eliodoro Deward FALCON, CRNAPre-anesthesia Checklist: Patient identified, Emergency Drugs available, Suction available and Patient being monitored Patient Re-evaluated:Patient Re-evaluated prior to induction Oxygen Delivery Method: Nasal cannula Placement Confirmation: positive ETCO2

## 2023-03-15 NOTE — Anesthesia Preprocedure Evaluation (Signed)
 Anesthesia Evaluation  Patient identified by MRN, date of birth, ID band Patient awake    Reviewed: Allergy & Precautions, H&P , NPO status , Patient's Chart, lab work & pertinent test results, reviewed documented beta blocker date and time   Airway Mallampati: II  TM Distance: >3 FB Neck ROM: full    Dental no notable dental hx.    Pulmonary neg pulmonary ROS, former smoker   Pulmonary exam normal breath sounds clear to auscultation       Cardiovascular Exercise Tolerance: Good hypertension, negative cardio ROS  Rhythm:regular Rate:Normal     Neuro/Psych  Headaches PSYCHIATRIC DISORDERS      negative neurological ROS  negative psych ROS   GI/Hepatic negative GI ROS, Neg liver ROS,,,  Endo/Other  negative endocrine ROSHypothyroidism    Renal/GU negative Renal ROS  negative genitourinary   Musculoskeletal   Abdominal   Peds  Hematology negative hematology ROS (+) Blood dyscrasia, anemia   Anesthesia Other Findings   Reproductive/Obstetrics negative OB ROS                             Anesthesia Physical Anesthesia Plan  ASA: 2  Anesthesia Plan: MAC   Post-op Pain Management:    Induction:   PONV Risk Score and Plan:   Airway Management Planned:   Additional Equipment:   Intra-op Plan:   Post-operative Plan:   Informed Consent: I have reviewed the patients History and Physical, chart, labs and discussed the procedure including the risks, benefits and alternatives for the proposed anesthesia with the patient or authorized representative who has indicated his/her understanding and acceptance.     Dental Advisory Given  Plan Discussed with: CRNA  Anesthesia Plan Comments:        Anesthesia Quick Evaluation

## 2023-03-15 NOTE — Op Note (Signed)
 Date of procedure: 03/15/23  Pre-operative diagnosis:  Visually significant combined form age-related cataract, Right Eye (H25.811)  Post-operative diagnosis:  Visually significant combined form age-related cataract, Right Eye (H25.811)  Procedure: Removal of cataract via phacoemulsification and insertion of intra-ocular lens Vicci and Johnson DIB00 +12.0D into the capsular bag of the Right Eye  Attending surgeon: Lynwood LABOR. Kush Farabee, MD, MA  Anesthesia: MAC, Topical Akten   Complications: None  Estimated Blood Loss: <30mL (minimal)  Specimens: None  Implants: As above  Indications:  Visually significant age-related cataract, Right Eye  Procedure:  The patient was seen and identified in the pre-operative area. The operative eye was identified and dilated.  The operative eye was marked.  Topical anesthesia was administered to the operative eye.     The patient was then to the operative suite and placed in the supine position.  A timeout was performed confirming the patient, procedure to be performed, and all other relevant information.   The patient's face was prepped and draped in the usual fashion for intra-ocular surgery.  A lid speculum was placed into the operative eye and the surgical microscope moved into place and focused.  A superotemporal paracentesis was created using a 20 gauge paracentesis blade.  Shugarcaine was injected into the anterior chamber.  Viscoelastic was injected into the anterior chamber.  A temporal clear-corneal main wound incision was created using a 2.35mm microkeratome.  A continuous curvilinear capsulorrhexis was initiated using an irrigating cystitome and completed using capsulorrhexis forceps.  Hydrodissection and hydrodeliniation were performed.  Viscoelastic was injected into the anterior chamber.  A phacoemulsification handpiece and a chopper as a second instrument were used to remove the nucleus and epinucleus. The irrigation/aspiration handpiece was used to  remove any remaining cortical material.   The capsular bag was reinflated with viscoelastic, checked, and found to be intact.  The intraocular lens was inserted into the capsular bag.  The irrigation/aspiration handpiece was used to remove any remaining viscoelastic.  The clear corneal wound and paracentesis wounds were then hydrated and checked with Weck-Cels to be watertight. 0.1mL of Moxfloxacin was injected into the anterior chamber. The lid-speculum was removed.  The drape was removed.  The patient's face was cleaned with a wet and dry 4x4. A clear shield was taped over the eye. The patient was taken to the post-operative care unit in good condition, having tolerated the procedure well.  Post-Op Instructions: The patient will follow up at [facility] for a same day post-operative evaluation and will receive all other orders and instructions.

## 2023-03-15 NOTE — Interval H&P Note (Signed)
 History and Physical Interval Note:  03/15/2023 12:55 PM  Rhonda [name]  has presented today for surgery, with the diagnosis of combined forms age related cataract, right eye.  The various methods of treatment have been discussed with the patient and family. After consideration of risks, benefits and other options for treatment, the patient has consented to  Procedure(s): CATARACT EXTRACTION PHACO AND INTRAOCULAR LENS PLACEMENT (IOC) (Right) as a surgical intervention.  The patient's history has been reviewed, patient examined, no change in status, stable for surgery.  I have reviewed the patient's chart and labs.  Questions were answered to the patient's satisfaction.     HARRIE AGENT

## 2023-03-15 NOTE — Discharge Instructions (Signed)
 Please discharge patient when stable, will follow up today with Dr. June Leap at the [facility] office immediately following discharge.  Leave shield in place until visit.  All paperwork with discharge instructions will be given at the office.  [facility] Address:  [address]  [place], [place] [address]

## 2023-03-18 ENCOUNTER — Encounter (HOSPITAL_COMMUNITY): Payer: Self-pay | Admitting: Ophthalmology

## 2023-03-20 ENCOUNTER — Encounter (HOSPITAL_COMMUNITY): Payer: Medicare HMO

## 2023-03-22 NOTE — Anesthesia Postprocedure Evaluation (Signed)
 Anesthesia Post Note  Patient: Rhonda [name]  Procedure(s) Performed: CATARACT EXTRACTION PHACO AND INTRAOCULAR LENS PLACEMENT (IOC) (Right: Eye)  Patient location during evaluation: Phase II Anesthesia Type: MAC Level of consciousness: awake Pain management: pain level controlled Vital Signs Assessment: post-procedure vital signs reviewed and stable Respiratory status: spontaneous breathing and respiratory function stable Cardiovascular status: blood pressure returned to baseline and stable Postop Assessment: no headache and no apparent nausea or vomiting Anesthetic complications: no Comments: Late entry   No notable events documented.   Last Vitals:  Vitals:   03/15/23 1224 03/15/23 1319  BP: (!) 147/76 (!) 148/80  Pulse: 68 67  Resp: 18 16  Temp: 36.7 C 36.8 C  SpO2: 100% 97%    Last Pain:  Vitals:   03/18/23 0935  TempSrc:   PainSc: 0-No pain                 [name]

## 2023-03-25 ENCOUNTER — Ambulatory Visit (HOSPITAL_COMMUNITY): Admit: 2023-03-25 | Payer: Medicare HMO | Admitting: Ophthalmology

## 2023-03-25 SURGERY — PHACOEMULSIFICATION, CATARACT, WITH IOL INSERTION
Anesthesia: Monitor Anesthesia Care | Laterality: Right

## 2023-04-09 DIAGNOSIS — R945 Abnormal results of liver function studies: Secondary | ICD-10-CM | POA: Diagnosis not present

## 2023-04-09 DIAGNOSIS — M1991 Primary osteoarthritis, unspecified site: Secondary | ICD-10-CM | POA: Diagnosis not present

## 2023-04-09 DIAGNOSIS — R5383 Other fatigue: Secondary | ICD-10-CM | POA: Diagnosis not present

## 2023-04-09 DIAGNOSIS — E663 Overweight: Secondary | ICD-10-CM | POA: Diagnosis not present

## 2023-04-09 DIAGNOSIS — M0579 Rheumatoid arthritis with rheumatoid factor of multiple sites without organ or systems involvement: Secondary | ICD-10-CM | POA: Diagnosis not present

## 2023-04-09 DIAGNOSIS — Z6828 Body mass index (BMI) 28.0-28.9, adult: Secondary | ICD-10-CM | POA: Diagnosis not present

## 2023-04-09 DIAGNOSIS — Z79899 Other long term (current) drug therapy: Secondary | ICD-10-CM | POA: Diagnosis not present

## 2023-04-10 ENCOUNTER — Telehealth: Payer: Self-pay | Admitting: Neurology

## 2023-04-10 NOTE — Telephone Encounter (Signed)
rs appointment

## 2023-04-11 ENCOUNTER — Ambulatory Visit: Payer: Medicare HMO | Admitting: Neurology

## 2023-04-15 DIAGNOSIS — H524 Presbyopia: Secondary | ICD-10-CM | POA: Diagnosis not present

## 2023-04-17 ENCOUNTER — Other Ambulatory Visit (HOSPITAL_COMMUNITY): Payer: Self-pay | Admitting: Internal Medicine

## 2023-04-17 DIAGNOSIS — Z1231 Encounter for screening mammogram for malignant neoplasm of breast: Secondary | ICD-10-CM

## 2023-05-21 DIAGNOSIS — E063 Autoimmune thyroiditis: Secondary | ICD-10-CM | POA: Diagnosis not present

## 2023-05-23 ENCOUNTER — Ambulatory Visit (HOSPITAL_COMMUNITY): Payer: Medicare HMO

## 2023-05-27 DIAGNOSIS — E782 Mixed hyperlipidemia: Secondary | ICD-10-CM | POA: Diagnosis not present

## 2023-05-27 DIAGNOSIS — R5383 Other fatigue: Secondary | ICD-10-CM | POA: Diagnosis not present

## 2023-05-27 DIAGNOSIS — E039 Hypothyroidism, unspecified: Secondary | ICD-10-CM | POA: Diagnosis not present

## 2023-05-30 DIAGNOSIS — K5904 Chronic idiopathic constipation: Secondary | ICD-10-CM | POA: Diagnosis not present

## 2023-05-30 DIAGNOSIS — E039 Hypothyroidism, unspecified: Secondary | ICD-10-CM | POA: Diagnosis not present

## 2023-05-30 DIAGNOSIS — Z87891 Personal history of nicotine dependence: Secondary | ICD-10-CM | POA: Diagnosis not present

## 2023-05-30 DIAGNOSIS — E782 Mixed hyperlipidemia: Secondary | ICD-10-CM | POA: Diagnosis not present

## 2023-05-30 DIAGNOSIS — Z79899 Other long term (current) drug therapy: Secondary | ICD-10-CM | POA: Diagnosis not present

## 2023-05-30 DIAGNOSIS — M069 Rheumatoid arthritis, unspecified: Secondary | ICD-10-CM | POA: Diagnosis not present

## 2023-05-30 DIAGNOSIS — G43909 Migraine, unspecified, not intractable, without status migrainosus: Secondary | ICD-10-CM | POA: Diagnosis not present

## 2023-05-30 DIAGNOSIS — R5383 Other fatigue: Secondary | ICD-10-CM | POA: Diagnosis not present

## 2023-05-30 DIAGNOSIS — J302 Other seasonal allergic rhinitis: Secondary | ICD-10-CM | POA: Diagnosis not present

## 2023-05-30 DIAGNOSIS — I10 Essential (primary) hypertension: Secondary | ICD-10-CM | POA: Diagnosis not present

## 2023-05-31 ENCOUNTER — Ambulatory Visit (HOSPITAL_COMMUNITY)
Admission: RE | Admit: 2023-05-31 | Discharge: 2023-05-31 | Disposition: A | Discharge status: Home / Self Care | Source: Ambulatory Visit | Attending: Internal Medicine | Admitting: Internal Medicine

## 2023-05-31 DIAGNOSIS — Z1231 Encounter for screening mammogram for malignant neoplasm of breast: Secondary | ICD-10-CM | POA: Insufficient documentation

## 2023-07-24 ENCOUNTER — Ambulatory Visit: Payer: Medicare HMO | Admitting: Neurology

## 2023-08-29 DIAGNOSIS — M0579 Rheumatoid arthritis with rheumatoid factor of multiple sites without organ or systems involvement: Secondary | ICD-10-CM | POA: Diagnosis not present

## 2023-08-29 DIAGNOSIS — Z79899 Other long term (current) drug therapy: Secondary | ICD-10-CM | POA: Diagnosis not present

## 2023-08-29 DIAGNOSIS — E663 Overweight: Secondary | ICD-10-CM | POA: Diagnosis not present

## 2023-08-29 DIAGNOSIS — R5383 Other fatigue: Secondary | ICD-10-CM | POA: Diagnosis not present

## 2023-08-29 DIAGNOSIS — R945 Abnormal results of liver function studies: Secondary | ICD-10-CM | POA: Diagnosis not present

## 2023-08-29 DIAGNOSIS — Z6828 Body mass index (BMI) 28.0-28.9, adult: Secondary | ICD-10-CM | POA: Diagnosis not present

## 2023-08-29 DIAGNOSIS — Z1211 Encounter for screening for malignant neoplasm of colon: Secondary | ICD-10-CM | POA: Diagnosis not present

## 2023-08-29 DIAGNOSIS — M1991 Primary osteoarthritis, unspecified site: Secondary | ICD-10-CM | POA: Diagnosis not present

## 2023-09-03 LAB — COLOGUARD: COLOGUARD: POSITIVE — AB

## 2023-09-26 ENCOUNTER — Encounter (INDEPENDENT_AMBULATORY_CARE_PROVIDER_SITE_OTHER): Payer: Self-pay | Admitting: *Deleted

## 2023-09-26 DIAGNOSIS — R195 Other fecal abnormalities: Secondary | ICD-10-CM | POA: Diagnosis not present

## 2023-10-14 ENCOUNTER — Ambulatory Visit (INDEPENDENT_AMBULATORY_CARE_PROVIDER_SITE_OTHER): Admitting: Gastroenterology

## 2023-10-14 ENCOUNTER — Encounter (INDEPENDENT_AMBULATORY_CARE_PROVIDER_SITE_OTHER): Payer: Self-pay | Admitting: Gastroenterology

## 2023-10-14 VITALS — BP 134/82 | HR 69 | Temp 97.1°F | Ht 64.0 in | Wt 168.5 lb

## 2023-10-14 DIAGNOSIS — R195 Other fecal abnormalities: Secondary | ICD-10-CM

## 2023-10-14 DIAGNOSIS — Z1212 Encounter for screening for malignant neoplasm of rectum: Secondary | ICD-10-CM

## 2023-10-14 DIAGNOSIS — K5900 Constipation, unspecified: Secondary | ICD-10-CM

## 2023-10-14 DIAGNOSIS — K5904 Chronic idiopathic constipation: Secondary | ICD-10-CM

## 2023-10-14 NOTE — Progress Notes (Signed)
 Toribio Fortune, M.D. Gastroenterology & Hepatology [facility]enter[facility] [address] [place], [place] [address] Primary Care Physician: Shona Norleen PEDLAR, MD [address] [place] [address]  Referring MD: PCP  Chief Complaint: Positive Cologuard  History of Present Illness: Rhonda [name] is a 73 y.o. female with past medical history of rheumatoid arthritis, hypertension, hypothyroidism, who presents for evaluation of positive Cologuard.  Patient had a positive Cologuard on 08/29/2023.  Patient is currently on Linzess 145 mcg/day and Miralax as needed for constipation. Has had severe constipation for multiple years, at least since she was a teenager. Patient reports when she does not take her laxatives, she will need to strain significantly as she had bad constipation. Only one time she had to do manual maneuvers and saw some blood in her  stools. Patient has never tried taking laxatives on a  regular basis as she forgets taking the medicines.  The patient denies having any nausea, vomiting, fever, chills, hematochezia, melena, hematemesis, abdominal distention, abdominal pain, diarrhea, jaundice, pruritus or weight loss.  Last TSH on 05/21/23 - 1.93  Last ZHI:wzczm Last Colonoscopy:12-15 years by Dr. Edsel, reports she had hemorrhoids but no reports are available.  FHx: neg for any gastrointestinal/liver disease, no malignancies Social: quit  smoking 20 years ago, may drink a beer or two per day, neg illicit drug use Surgical: hysterectomy  Past Medical History: Past Medical History:  Diagnosis Date   Arthritis    RA   Hypertension    Hypothyroidism    Migraine    Thyroid  disease     Past Surgical History: Past Surgical History:  Procedure Laterality Date   ABDOMINAL HYSTERECTOMY     CATARACT EXTRACTION W/PHACO Left 03/04/2023   Procedure: CATARACT EXTRACTION PHACO AND INTRAOCULAR LENS PLACEMENT (IOC);  Surgeon: Harrie Agent,  MD;  Location: [facility];  Service: Ophthalmology;  Laterality: Left;  CDE: 6.37   CATARACT EXTRACTION W/PHACO Right 03/15/2023   Procedure: CATARACT EXTRACTION PHACO AND INTRAOCULAR LENS PLACEMENT (IOC);  Surgeon: Harrie Agent, MD;  Location: [facility];  Service: Ophthalmology;  Laterality: Right;  CDE 6.86   HERNIA REPAIR     as child- umbilical   HIP SURGERY Bilateral    JOINT REPLACEMENT      Family History: Family History  Problem Relation Age of Onset   Mental illness Mother    Mental illness Brother    Mental illness Other     Social History: Social History   Tobacco Use  Smoking Status Former   Current packs/day: 0.00   Types: Cigarettes   Quit date: 04/29/1993   Years since quitting: 30.4  Smokeless Tobacco Never   Social History   Substance and Sexual Activity  Alcohol Use Yes   Comment: occ   Social History   Substance and Sexual Activity  Drug Use No    Allergies: No Known Allergies  Medications: Current Outpatient Medications  Medication Sig Dispense Refill   aspirin-acetaminophen -caffeine (EXCEDRIN EXTRA STRENGTH) 250-250-65 MG per tablet Take 2 tablets by mouth every 6 (six) hours as needed for headache.     Carboxymethylcellulose Sod PF (THERATEARS) 1 % GEL Apply 1 % to eye daily.     etanercept (ENBREL) 25 MG injection Inject 25 mg into the skin once a week.     folic acid  (FOLVITE ) 1 MG tablet Take 1 mg by mouth daily. (Patient taking differently: Take 3 mg by mouth daily.)     Homeopathic Products (ZICAM ALLERGY RELIEF  NA) Place 1 spray into the nose daily as needed.     ibuprofen  (ADVIL ,MOTRIN ) 200 MG tablet Take 200 mg by mouth every 6 (six) hours as needed.     levothyroxine (SYNTHROID, LEVOTHROID) 25 MCG tablet Take 25 mcg by mouth daily before breakfast.      linaclotide (LINZESS) 145 MCG CAPS capsule Take 145 mcg by mouth daily before breakfast. (Patient taking differently: Take 145 mcg by mouth once a week.)     methotrexate  (RHEUMATREX) 2.5 MG  tablet Take 2.5 mg by mouth once a week. Caution:Chemotherapy. Protect from light. Takes 6 every Wednesday (Patient taking differently: Take 2.5 mg by mouth once a week. 15 mg once per week per patient.)     metoprolol succinate (TOPROL-XL) 25 MG 24 hr tablet Take 25 mg by mouth daily as needed (for blood pressure).  (Patient taking differently: Take 25 mg by mouth daily.)     ondansetron  (ZOFRAN ) 8 MG tablet Take 8 mg by mouth every 8 (eight) hours as needed for nausea or vomiting.     Rimegepant Sulfate (NURTEC) 75 MG TBDP Take 75 mg by mouth daily. (Patient taking differently: Take 75 mg by mouth as needed.)     loratadine (CLARITIN) 10 MG tablet Take 10 mg by mouth daily.     metoprolol tartrate (LOPRESSOR) 25 MG tablet Take 25 mg by mouth 2 (two) times daily.     No current facility-administered medications for this visit.    Review of Systems: GENERAL: negative for malaise, night sweats HEENT: No changes in hearing or vision, no nose bleeds or other nasal problems. NECK: Negative for lumps, goiter, pain and significant neck swelling RESPIRATORY: Negative for cough, wheezing CARDIOVASCULAR: Negative for chest pain, leg swelling, palpitations, orthopnea GI: SEE HPI MUSCULOSKELETAL: Negative for joint pain or swelling, back pain, and muscle pain. SKIN: Negative for lesions, rash PSYCH: Negative for sleep disturbance, mood disorder and recent psychosocial stressors. HEMATOLOGY Negative for prolonged bleeding, bruising easily, and swollen nodes. ENDOCRINE: Negative for cold or heat intolerance, polyuria, polydipsia and goiter. NEURO: negative for tremor, gait imbalance, syncope and seizures. The remainder of the review of systems is noncontributory.   Physical Exam: BP 134/82 (BP Location: Left Arm, Patient Position: Sitting, Cuff Size: Normal)   Pulse 69   Temp (!) 97.1 F (36.2 C) (Temporal)   Ht 5' 4 (1.626 m)   Wt 168 lb 8 oz (76.4 kg)   BMI 28.92 kg/m  GENERAL: The patient  is AO x3, in no acute distress. HEENT: Head is normocephalic and atraumatic. EOMI are intact. Mouth is well hydrated and without lesions. NECK: Supple. No masses LUNGS: Clear to auscultation. No presence of rhonchi/wheezing/rales. Adequate chest expansion HEART: RRR, normal s1 and s2. ABDOMEN: Soft, nontender, no guarding, no peritoneal signs, and nondistended. BS +. No masses. EXTREMITIES: Without any cyanosis, clubbing, rash, lesions or edema. NEUROLOGIC: AOx3, no focal motor deficit. SKIN: no jaundice, no rashes   Imaging/Labs: as above  I personally reviewed and interpreted the available labs, imaging and endoscopic files.  Impression and Plan: Rhonda [name] is a 73 y.o. female with past medical history of rheumatoid arthritis, hypertension, hypothyroidism, who presents for evaluation of positive Cologuard.  Patient does not have any high risk factors for colorectal cancer malignancy and has been asymptomatic. Discussed cologuard test results in detail, specifically what it means when the test is positive or negative.  Discussed that there is a possibility that even when the test is positive there may not  be a polyp found on colonoscopy. More than 50% of the office visit was dedicated to discussing the procedure, including the day of and risks involved. Patient understands what the procedure involves including the benefits and any risks. Patient understands alternatives to the proposed procedure. Risks including (but not limited to) bleeding, tearing of the lining (perforation), rupture of adjacent organs, problems with heart and lung function, infection, and medication reactions. A small percentage of complications may require surgery, hospitalization, repeat endoscopic procedure, and/or transfusion. A small percentage of polyps and other tumors may not be seen.  Patient also endorsed having issues with constipation chronically.  Thyroid  function is adequate.  We discussed in detail that  ideally we should manage this with regular laxative intake.  She is taking both Linzess and MiraLAX as needed.  Will attempt first trying MiraLAX on a regular basis.  - Schedule colonoscopy - Start taking Miralax 1 capful every day for one week. If bowel movements do not improve, increase to 2 capfuls every day. If after two weeks there is no improvement, increase to 2 capfuls in AM and one at night.  All questions were answered.      Toribio Fortune, MD Gastroenterology and Hepatology [facility]

## 2023-10-14 NOTE — H&P (View-Only) (Signed)
 Toribio Fortune, M.D. Gastroenterology & Hepatology [facility] [address] [place], [place] [address] Primary Care Physician: Shona Norleen PEDLAR, MD [address] [place] [address]  Referring MD: PCP  Chief Complaint: Positive Cologuard  History of Present Illness: Rhonda [name] is a 73 y.o. female with past medical history of rheumatoid arthritis, hypertension, hypothyroidism, who presents for evaluation of positive Cologuard.  Patient had a positive Cologuard on 08/29/2023.  Patient is currently on Linzess 145 mcg/day and Miralax as needed for constipation. Has had severe constipation for multiple years, at least since she was a teenager. Patient reports when she does not take her laxatives, she will need to strain significantly as she had bad constipation. Only one time she had to do manual maneuvers and saw some blood in her  stools. Patient has never tried taking laxatives on a  regular basis as she forgets taking the medicines.  The patient denies having any nausea, vomiting, fever, chills, hematochezia, melena, hematemesis, abdominal distention, abdominal pain, diarrhea, jaundice, pruritus or weight loss.  Last TSH on 05/21/23 - 1.93  Last ZHI:wzczm Last Colonoscopy:12-15 years by Dr. Edsel, reports she had hemorrhoids but no reports are available.  FHx: neg for any gastrointestinal/liver disease, no malignancies Social: quit  smoking 20 years ago, may drink a beer or two per day, neg illicit drug use Surgical: hysterectomy  Past Medical History: Past Medical History:  Diagnosis Date   Arthritis    RA   Hypertension    Hypothyroidism    Migraine    Thyroid  disease     Past Surgical History: Past Surgical History:  Procedure Laterality Date   ABDOMINAL HYSTERECTOMY     CATARACT EXTRACTION W/PHACO Left 03/04/2023   Procedure: CATARACT EXTRACTION PHACO AND INTRAOCULAR LENS PLACEMENT (IOC);  Surgeon: Harrie Agent,  MD;  Location: [facility];  Service: Ophthalmology;  Laterality: Left;  CDE: 6.37   CATARACT EXTRACTION W/PHACO Right 03/15/2023   Procedure: CATARACT EXTRACTION PHACO AND INTRAOCULAR LENS PLACEMENT (IOC);  Surgeon: Harrie Agent, MD;  Location: [facility];  Service: Ophthalmology;  Laterality: Right;  CDE 6.86   HERNIA REPAIR     as child- umbilical   HIP SURGERY Bilateral    JOINT REPLACEMENT      Family History: Family History  Problem Relation Age of Onset   Mental illness Mother    Mental illness Brother    Mental illness Other     Social History: Social History   Tobacco Use  Smoking Status Former   Current packs/day: 0.00   Types: Cigarettes   Quit date: 04/29/1993   Years since quitting: 30.4  Smokeless Tobacco Never   Social History   Substance and Sexual Activity  Alcohol Use Yes   Comment: occ   Social History   Substance and Sexual Activity  Drug Use No    Allergies: No Known Allergies  Medications: Current Outpatient Medications  Medication Sig Dispense Refill   aspirin-acetaminophen -caffeine (EXCEDRIN EXTRA STRENGTH) 250-250-65 MG per tablet Take 2 tablets by mouth every 6 (six) hours as needed for headache.     Carboxymethylcellulose Sod PF (THERATEARS) 1 % GEL Apply 1 % to eye daily.     etanercept (ENBREL) 25 MG injection Inject 25 mg into the skin once a week.     folic acid  (FOLVITE ) 1 MG tablet Take 1 mg by mouth daily. (Patient taking differently: Take 3 mg by mouth daily.)     Homeopathic Products (ZICAM ALLERGY RELIEF  NA) Place 1 spray into the nose daily as needed.     ibuprofen  (ADVIL ,MOTRIN ) 200 MG tablet Take 200 mg by mouth every 6 (six) hours as needed.     levothyroxine (SYNTHROID, LEVOTHROID) 25 MCG tablet Take 25 mcg by mouth daily before breakfast.      linaclotide (LINZESS) 145 MCG CAPS capsule Take 145 mcg by mouth daily before breakfast. (Patient taking differently: Take 145 mcg by mouth once a week.)     methotrexate  (RHEUMATREX) 2.5 MG  tablet Take 2.5 mg by mouth once a week. Caution:Chemotherapy. Protect from light. Takes 6 every Wednesday (Patient taking differently: Take 2.5 mg by mouth once a week. 15 mg once per week per patient.)     metoprolol succinate (TOPROL-XL) 25 MG 24 hr tablet Take 25 mg by mouth daily as needed (for blood pressure).  (Patient taking differently: Take 25 mg by mouth daily.)     ondansetron  (ZOFRAN ) 8 MG tablet Take 8 mg by mouth every 8 (eight) hours as needed for nausea or vomiting.     Rimegepant Sulfate (NURTEC) 75 MG TBDP Take 75 mg by mouth daily. (Patient taking differently: Take 75 mg by mouth as needed.)     loratadine (CLARITIN) 10 MG tablet Take 10 mg by mouth daily.     metoprolol tartrate (LOPRESSOR) 25 MG tablet Take 25 mg by mouth 2 (two) times daily.     No current facility-administered medications for this visit.    Review of Systems: GENERAL: negative for malaise, night sweats HEENT: No changes in hearing or vision, no nose bleeds or other nasal problems. NECK: Negative for lumps, goiter, pain and significant neck swelling RESPIRATORY: Negative for cough, wheezing CARDIOVASCULAR: Negative for chest pain, leg swelling, palpitations, orthopnea GI: SEE HPI MUSCULOSKELETAL: Negative for joint pain or swelling, back pain, and muscle pain. SKIN: Negative for lesions, rash PSYCH: Negative for sleep disturbance, mood disorder and recent psychosocial stressors. HEMATOLOGY Negative for prolonged bleeding, bruising easily, and swollen nodes. ENDOCRINE: Negative for cold or heat intolerance, polyuria, polydipsia and goiter. NEURO: negative for tremor, gait imbalance, syncope and seizures. The remainder of the review of systems is noncontributory.   Physical Exam: BP 134/82 (BP Location: Left Arm, Patient Position: Sitting, Cuff Size: Normal)   Pulse 69   Temp (!) 97.1 F (36.2 C) (Temporal)   Ht 5' 4 (1.626 m)   Wt 168 lb 8 oz (76.4 kg)   BMI 28.92 kg/m  GENERAL: The patient  is AO x3, in no acute distress. HEENT: Head is normocephalic and atraumatic. EOMI are intact. Mouth is well hydrated and without lesions. NECK: Supple. No masses LUNGS: Clear to auscultation. No presence of rhonchi/wheezing/rales. Adequate chest expansion HEART: RRR, normal s1 and s2. ABDOMEN: Soft, nontender, no guarding, no peritoneal signs, and nondistended. BS +. No masses. EXTREMITIES: Without any cyanosis, clubbing, rash, lesions or edema. NEUROLOGIC: AOx3, no focal motor deficit. SKIN: no jaundice, no rashes   Imaging/Labs: as above  I personally reviewed and interpreted the available labs, imaging and endoscopic files.  Impression and Plan: KIERSTIN JANUARY is a 73 y.o. female with past medical history of rheumatoid arthritis, hypertension, hypothyroidism, who presents for evaluation of positive Cologuard.  Patient does not have any high risk factors for colorectal cancer malignancy and has been asymptomatic. Discussed cologuard test results in detail, specifically what it means when the test is positive or negative.  Discussed that there is a possibility that even when the test is positive there may not  be a polyp found on colonoscopy. More than 50% of the office visit was dedicated to discussing the procedure, including the day of and risks involved. Patient understands what the procedure involves including the benefits and any risks. Patient understands alternatives to the proposed procedure. Risks including (but not limited to) bleeding, tearing of the lining (perforation), rupture of adjacent organs, problems with heart and lung function, infection, and medication reactions. A small percentage of complications may require surgery, hospitalization, repeat endoscopic procedure, and/or transfusion. A small percentage of polyps and other tumors may not be seen.  Patient also endorsed having issues with constipation chronically.  Thyroid  function is adequate.  We discussed in detail that  ideally we should manage this with regular laxative intake.  She is taking both Linzess and MiraLAX as needed.  Will attempt first trying MiraLAX on a regular basis.  - Schedule colonoscopy - Start taking Miralax 1 capful every day for one week. If bowel movements do not improve, increase to 2 capfuls every day. If after two weeks there is no improvement, increase to 2 capfuls in AM and one at night.  All questions were answered.      Toribio Fortune, MD Gastroenterology and Hepatology [facility] Gastroenterology

## 2023-10-14 NOTE — Patient Instructions (Signed)
Schedule colonoscopy Start taking Miralax 1 capful every day for one week. If bowel movements do not improve, increase to 2 capfuls every day. If after two weeks there is no improvement, increase to 2 capfuls in AM and one at night.

## 2023-10-17 ENCOUNTER — Encounter: Payer: Self-pay | Admitting: *Deleted

## 2023-10-17 ENCOUNTER — Other Ambulatory Visit: Payer: Self-pay | Admitting: *Deleted

## 2023-10-17 MED ORDER — SUTAB 1479-225-188 MG PO TABS: 12.0000 | ORAL_TABLET | ORAL | 0 refills | Status: DC

## 2023-10-24 DIAGNOSIS — E063 Autoimmune thyroiditis: Secondary | ICD-10-CM | POA: Diagnosis not present

## 2023-10-31 ENCOUNTER — Encounter (HOSPITAL_COMMUNITY)
Admission: RE | Admit: 2023-10-31 | Discharge: 2023-10-31 | Disposition: A | Discharge status: Home / Self Care | Source: Ambulatory Visit | Attending: Gastroenterology | Admitting: Gastroenterology

## 2023-10-31 ENCOUNTER — Encounter (HOSPITAL_COMMUNITY): Payer: Self-pay

## 2023-10-31 ENCOUNTER — Other Ambulatory Visit: Payer: Self-pay

## 2023-11-05 ENCOUNTER — Ambulatory Visit (HOSPITAL_BASED_OUTPATIENT_CLINIC_OR_DEPARTMENT_OTHER): Admitting: Anesthesiology

## 2023-11-05 ENCOUNTER — Ambulatory Visit (HOSPITAL_COMMUNITY): Admitting: Anesthesiology

## 2023-11-05 ENCOUNTER — Encounter (INDEPENDENT_AMBULATORY_CARE_PROVIDER_SITE_OTHER): Payer: Self-pay | Admitting: *Deleted

## 2023-11-05 ENCOUNTER — Encounter (HOSPITAL_COMMUNITY)
Admission: RE | Disposition: A | Discharge status: Home / Self Care | Payer: Self-pay | Source: Home / Self Care | Attending: Gastroenterology

## 2023-11-05 ENCOUNTER — Ambulatory Visit (HOSPITAL_COMMUNITY)
Admission: RE | Admit: 2023-11-05 | Discharge: 2023-11-05 | Disposition: A | Discharge status: Home / Self Care | Attending: Gastroenterology | Admitting: Gastroenterology

## 2023-11-05 DIAGNOSIS — R195 Other fecal abnormalities: Secondary | ICD-10-CM | POA: Insufficient documentation

## 2023-11-05 DIAGNOSIS — Z87891 Personal history of nicotine dependence: Secondary | ICD-10-CM | POA: Diagnosis not present

## 2023-11-05 DIAGNOSIS — K5730 Diverticulosis of large intestine without perforation or abscess without bleeding: Secondary | ICD-10-CM | POA: Diagnosis not present

## 2023-11-05 DIAGNOSIS — D120 Benign neoplasm of cecum: Secondary | ICD-10-CM | POA: Insufficient documentation

## 2023-11-05 DIAGNOSIS — Z79899 Other long term (current) drug therapy: Secondary | ICD-10-CM | POA: Diagnosis not present

## 2023-11-05 DIAGNOSIS — M069 Rheumatoid arthritis, unspecified: Secondary | ICD-10-CM | POA: Insufficient documentation

## 2023-11-05 DIAGNOSIS — K5909 Other constipation: Secondary | ICD-10-CM | POA: Insufficient documentation

## 2023-11-05 DIAGNOSIS — Z1211 Encounter for screening for malignant neoplasm of colon: Secondary | ICD-10-CM | POA: Insufficient documentation

## 2023-11-05 DIAGNOSIS — E039 Hypothyroidism, unspecified: Secondary | ICD-10-CM | POA: Insufficient documentation

## 2023-11-05 DIAGNOSIS — I10 Essential (primary) hypertension: Secondary | ICD-10-CM | POA: Diagnosis not present

## 2023-11-05 DIAGNOSIS — K648 Other hemorrhoids: Secondary | ICD-10-CM | POA: Diagnosis not present

## 2023-11-05 DIAGNOSIS — K635 Polyp of colon: Secondary | ICD-10-CM | POA: Diagnosis not present

## 2023-11-05 HISTORY — PX: COLONOSCOPY: SHX5424

## 2023-11-05 LAB — HM COLONOSCOPY

## 2023-11-05 SURGERY — COLONOSCOPY
Anesthesia: General

## 2023-11-05 MED ORDER — LIDOCAINE 2% (20 MG/ML) 5 ML SYRINGE
INTRAMUSCULAR | Status: DC | PRN
Start: 2023-11-05 — End: 2023-11-05
  Administered 2023-11-05: 50 mg via INTRAVENOUS

## 2023-11-05 MED ORDER — PROPOFOL 10 MG/ML IV BOLUS
INTRAVENOUS | Status: DC | PRN
  Administered 2023-11-05: 50 mg via INTRAVENOUS

## 2023-11-05 MED ORDER — PROPOFOL 500 MG/50ML IV EMUL
INTRAVENOUS | Status: DC | PRN
Start: 2023-11-05 — End: 2023-11-05
  Administered 2023-11-05: 125 ug/kg/min via INTRAVENOUS

## 2023-11-05 MED ORDER — LACTATED RINGERS IV SOLN: INTRAVENOUS | Status: DC | PRN

## 2023-11-05 MED ORDER — LACTATED RINGERS IV SOLN: INTRAVENOUS | Status: DC

## 2023-11-05 NOTE — Discharge Instructions (Addendum)
 You are being discharged to home.  Resume your previous diet.  We are waiting for your pathology results.  Your physician has recommended a repeat colonoscopy for surveillance based on pathology results.

## 2023-11-05 NOTE — Interval H&P Note (Signed)
 History and Physical Interval Note:  11/05/2023 11:52 AM  Rhonda [name]  has presented today for surgery, with the diagnosis of positive cologuard.  The various methods of treatment have been discussed with the patient and family. After consideration of risks, benefits and other options for treatment, the patient has consented to  Procedure(s) with comments: COLONOSCOPY (N/A) - 1:15 pm, asa 1-2 as a surgical intervention.  The patient's history has been reviewed, patient examined, no change in status, stable for surgery.  I have reviewed the patient's chart and labs.  Questions were answered to the patient's satisfaction.     [name]

## 2023-11-05 NOTE — Anesthesia Preprocedure Evaluation (Signed)
 Anesthesia Evaluation  Patient identified by MRN, date of birth, ID band Patient awake    Reviewed: Allergy & Precautions, H&P , NPO status , Patient's Chart, lab work & pertinent test results, reviewed documented beta blocker date and time   Airway Mallampati: II  TM Distance: >3 FB Neck ROM: full    Dental no notable dental hx.    Pulmonary neg pulmonary ROS, former smoker   Pulmonary exam normal breath sounds clear to auscultation       Cardiovascular Exercise Tolerance: Good hypertension,  Rhythm:regular Rate:Normal     Neuro/Psych  Headaches PSYCHIATRIC DISORDERS         GI/Hepatic negative GI ROS, Neg liver ROS,,,  Endo/Other  Hypothyroidism    Renal/GU negative Renal ROS  negative genitourinary   Musculoskeletal   Abdominal   Peds  Hematology  (+) Blood dyscrasia, anemia   Anesthesia Other Findings   Reproductive/Obstetrics negative OB ROS                              Anesthesia Physical Anesthesia Plan  ASA: 2  Anesthesia Plan: General   Post-op Pain Management:    Induction:   PONV Risk Score and Plan: Propofol  infusion  Airway Management Planned:   Additional Equipment:   Intra-op Plan:   Post-operative Plan:   Informed Consent: I have reviewed the patients History and Physical, chart, labs and discussed the procedure including the risks, benefits and alternatives for the proposed anesthesia with the patient or authorized representative who has indicated his/her understanding and acceptance.     Dental Advisory Given  Plan Discussed with: CRNA  Anesthesia Plan Comments:         Anesthesia Quick Evaluation

## 2023-11-05 NOTE — Anesthesia Postprocedure Evaluation (Signed)
 Anesthesia Post Note  Patient: Rhonda [name]  Procedure(s) Performed: COLONOSCOPY  Patient location during evaluation: Short Stay Anesthesia Type: General Level of consciousness: awake and alert Pain management: pain level controlled Vital Signs Assessment: post-procedure vital signs reviewed and stable Respiratory status: spontaneous breathing Cardiovascular status: blood pressure returned to baseline and stable Postop Assessment: no apparent nausea or vomiting Anesthetic complications: no   No notable events documented.   Last Vitals:  Vitals:   11/05/23 1354 11/05/23 1400  BP: (!) 116/59   Pulse: 84   Resp: (!) 21   Temp: 36.4 C   SpO2:  100%    Last Pain:  Vitals:   11/05/23 1354  TempSrc: Oral  PainSc: 0-No pain                 Lorell Thibodaux

## 2023-11-05 NOTE — Op Note (Signed)
 [facility] Patient Name: Rhonda [name] Procedure Date: 11/05/2023 1:16 PM MRN: [id number] Date of Birth: 1950/11/20 Attending MD: Toribio Fortune , , [id number] CSN: [id number] Age: 73 Admit Type: Outpatient Procedure:                Colonoscopy Indications:              Positive Cologuard test Providers:                Toribio Fortune, Olam Ada, RN, Daphne Mulch                            Technician, Technician Referring MD:              Medicines:                Monitored Anesthesia Care Complications:            No immediate complications. Estimated Blood Loss:     Estimated blood loss: none. Procedure:                Pre-Anesthesia Assessment:                           - Prior to the procedure, a History and Physical                            was performed, and patient medications, allergies                            and sensitivities were reviewed. The patient's                            tolerance of previous anesthesia was reviewed.                           - The risks and benefits of the procedure and the                            sedation options and risks were discussed with the                            patient. All questions were answered and informed                            consent was obtained.                           - ASA Grade Assessment: II - A patient with mild                            systemic disease.                           After obtaining informed consent, the colonoscope                            was passed under direct vision. Throughout the  procedure, the patient's blood pressure, pulse, and                            oxygen saturations were monitored continuously. The                            [id number] ([id number]) Peds Colon was introduced                            through the anus and advanced to the the cecum,                            identified by appendiceal orifice and ileocecal                             valve. The colonoscopy was performed without                            difficulty. The patient tolerated the procedure                            well. The quality of the bowel preparation was                            excellent. Scope In: 1:33:42 PM Scope Out: 1:47:35 PM Scope Withdrawal Time: 0 hours 10 minutes 34 seconds  Total Procedure Duration: 0 hours 13 minutes 53 seconds  Findings:      The perianal and digital rectal examinations were normal.      A 6 mm polyp was found in the cecum. The polyp was sessile. The polyp       was removed with a cold snare. Resection and retrieval were complete.      Scattered small-mouthed diverticula were found in the sigmoid colon.      Non-bleeding internal hemorrhoids were found during retroflexion. The       hemorrhoids were small. Impression:               - One 6 mm polyp in the cecum, removed with a cold                            snare. Resected and retrieved.                           - Diverticulosis in the sigmoid colon.                           - Non-bleeding internal hemorrhoids. Moderate Sedation:      Per Anesthesia Care Recommendation:           - Discharge patient to home (ambulatory).                           - Resume previous diet.                           - Await pathology results.                           -  Repeat colonoscopy for surveillance based on                            pathology results. Procedure Code(s):        --- Professional ---                           [phone], Colonoscopy, flexible; with removal of                            tumor(s), polyp(s), or other lesion(s) by snare                            technique Diagnosis Code(s):        --- Professional ---                           D12.0, Benign neoplasm of cecum                           K64.8, Other hemorrhoids                           R19.5, Other fecal abnormalities                           K57.30, Diverticulosis of large intestine without                             perforation or abscess without bleeding CPT copyright 2022 [employer]. All rights reserved. The codes documented in this report are preliminary and upon coder review may  be revised to meet current compliance requirements. Toribio Fortune, MD Toribio Fortune,  11/05/2023 1:50:09 PM This report has been signed electronically. Number of Addenda: 0

## 2023-11-05 NOTE — Transfer of Care (Signed)
 Immediate Anesthesia Transfer of Care Note  Patient: Rhonda [name]  Procedure(s) Performed: COLONOSCOPY  Patient Location: Short Stay  Anesthesia Type:General  Level of Consciousness: awake  Airway & Oxygen Therapy: Patient Spontanous Breathing  Post-op Assessment: Report given to RN  Post vital signs: Reviewed and stable  Last Vitals:  Vitals Value Taken Time  BP 116/59 11/05/23 13:54  Temp 36.4 C 11/05/23 13:54  Pulse 84 11/05/23 13:54  Resp 21 11/05/23 13:54  SpO2      Last Pain:  Vitals:   11/05/23 1354  TempSrc: Oral  PainSc: 0-No pain         Complications: No notable events documented.

## 2023-11-07 ENCOUNTER — Encounter (HOSPITAL_COMMUNITY): Payer: Self-pay | Admitting: Gastroenterology

## 2023-11-07 ENCOUNTER — Ambulatory Visit: Payer: Self-pay | Admitting: Gastroenterology

## 2023-11-07 LAB — SURGICAL PATHOLOGY

## 2023-11-11 IMAGING — MG MM DIGITAL SCREENING BILAT W/ TOMO AND CAD
8 series · 8 of 24 positions shown · non-contrast
Comparison: Previous exam(s).

CLINICAL DATA: Screening.

EXAM:
DIGITAL SCREENING BILATERAL MAMMOGRAM WITH TOMOSYNTHESIS AND CAD
TECHNIQUE: Bilateral screening digital craniocaudal and mediolateral oblique
mammograms were obtained. Bilateral screening digital breast
tomosynthesis was performed. The images were evaluated with
computer-aided detection.

[L MLO synth-2D]
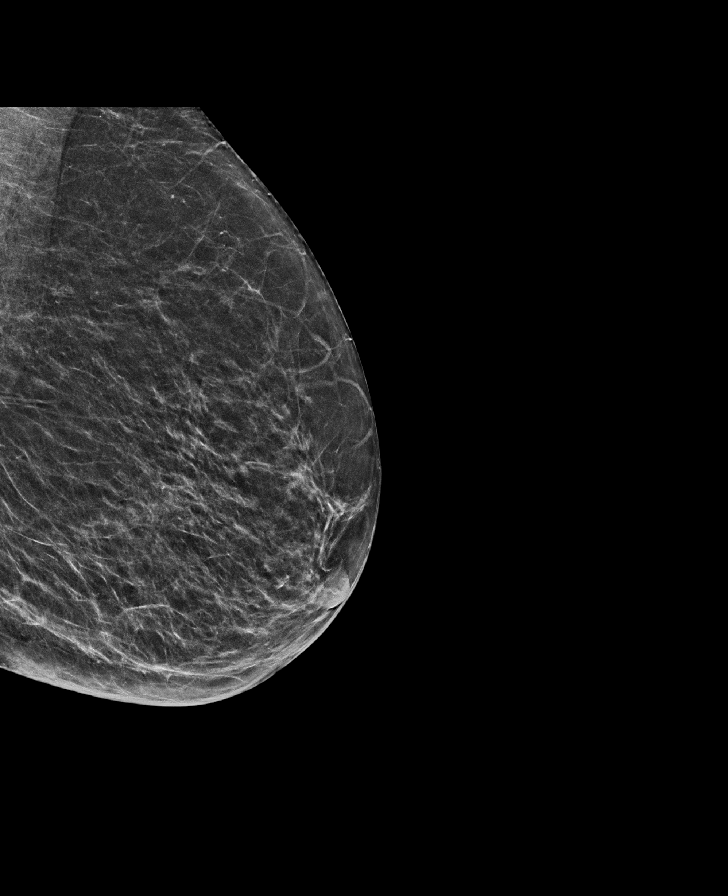

[R MLO synth-2D]
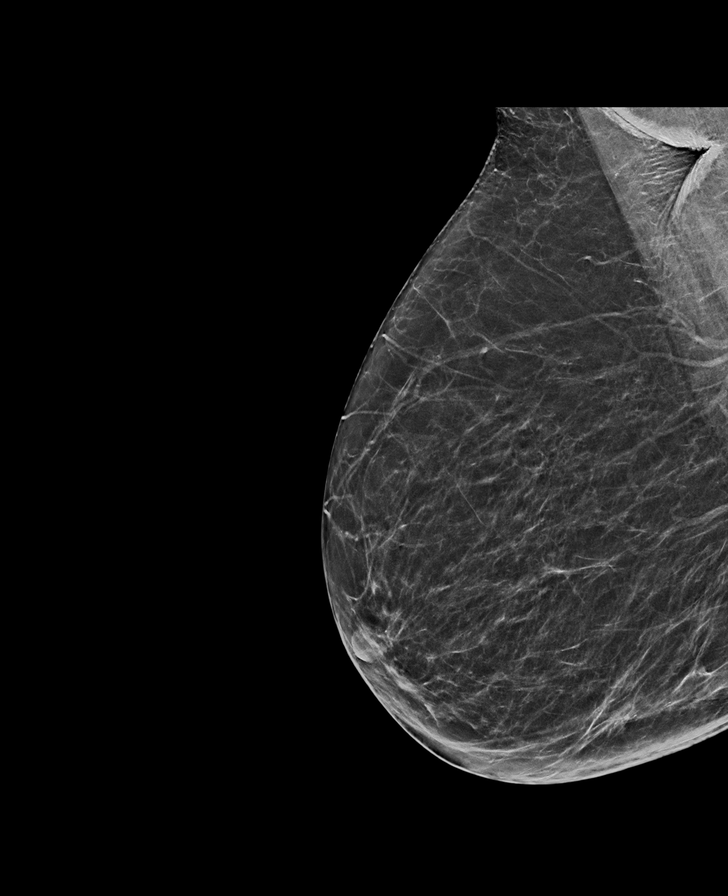

[R CC synth-2D]
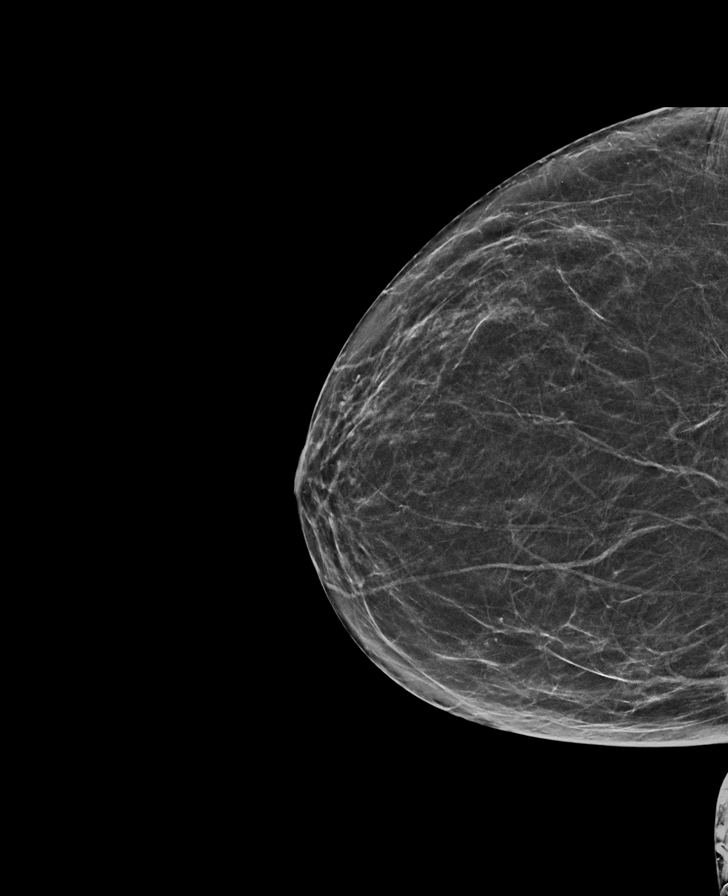

[L CC synth-2D]
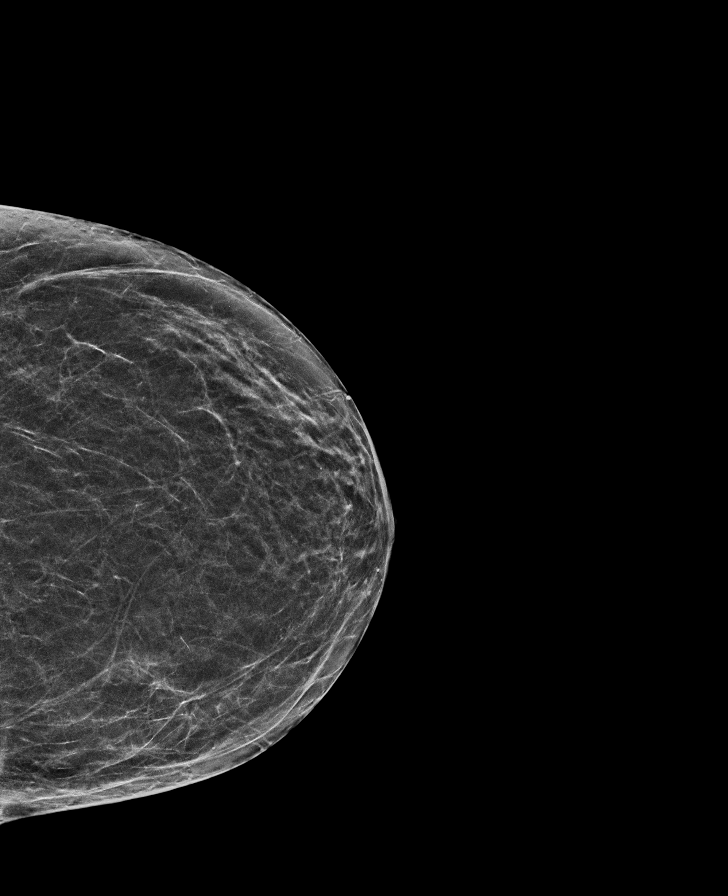

[R MLO tomo · tomo slice 32/63.0]
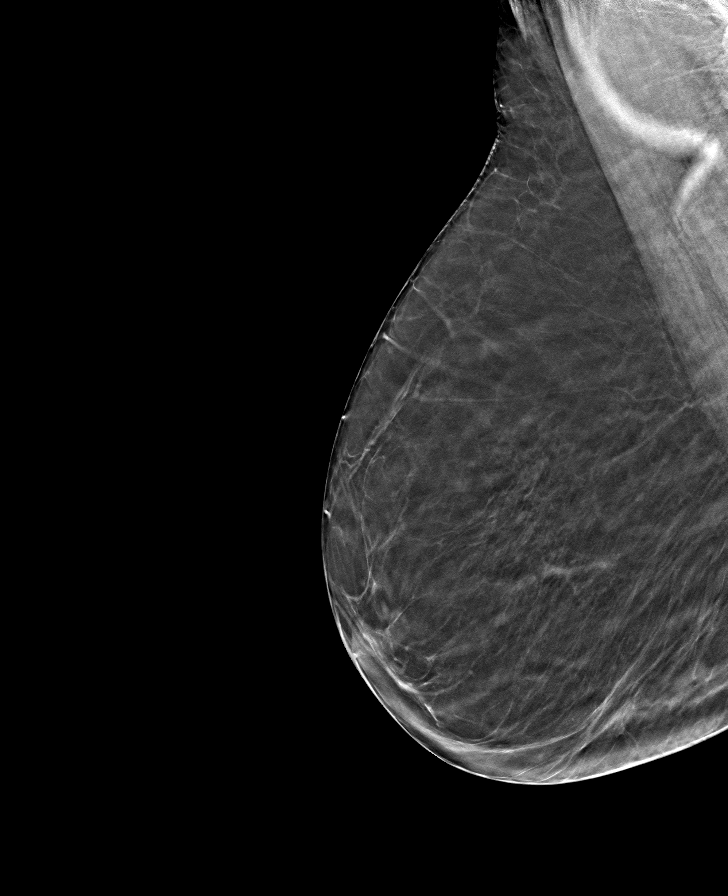

[L MLO tomo · tomo slice 29/57.0]
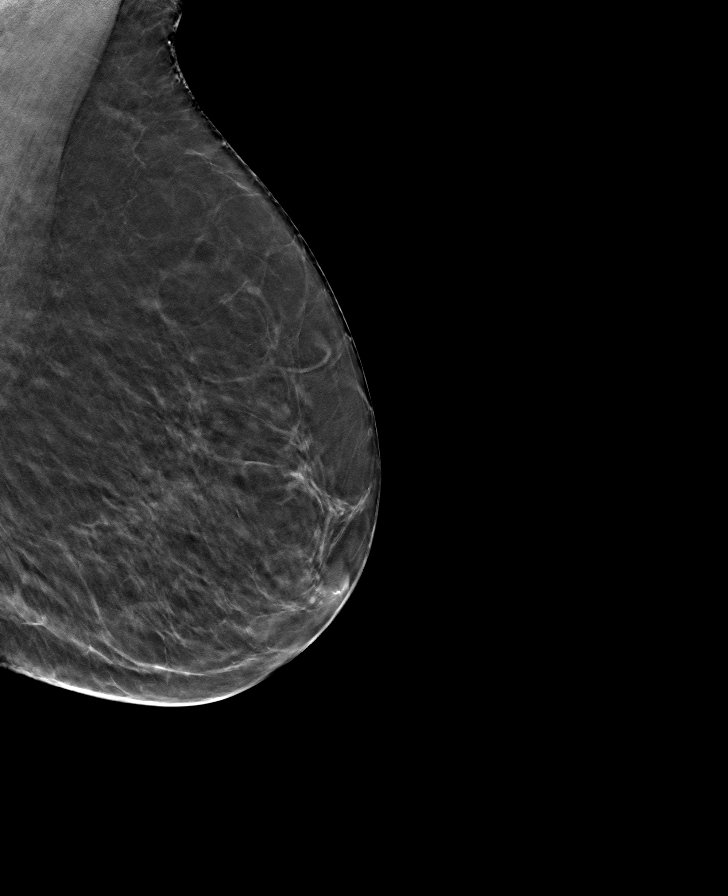

[L CC tomo · tomo slice 28/55.0]
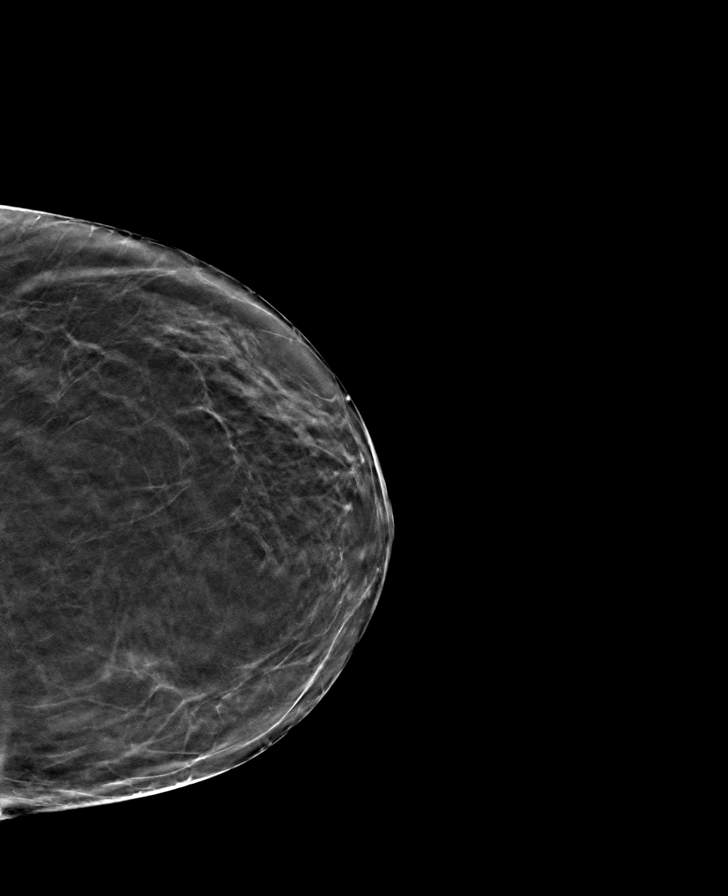

[R CC tomo · tomo slice 29/58.0]
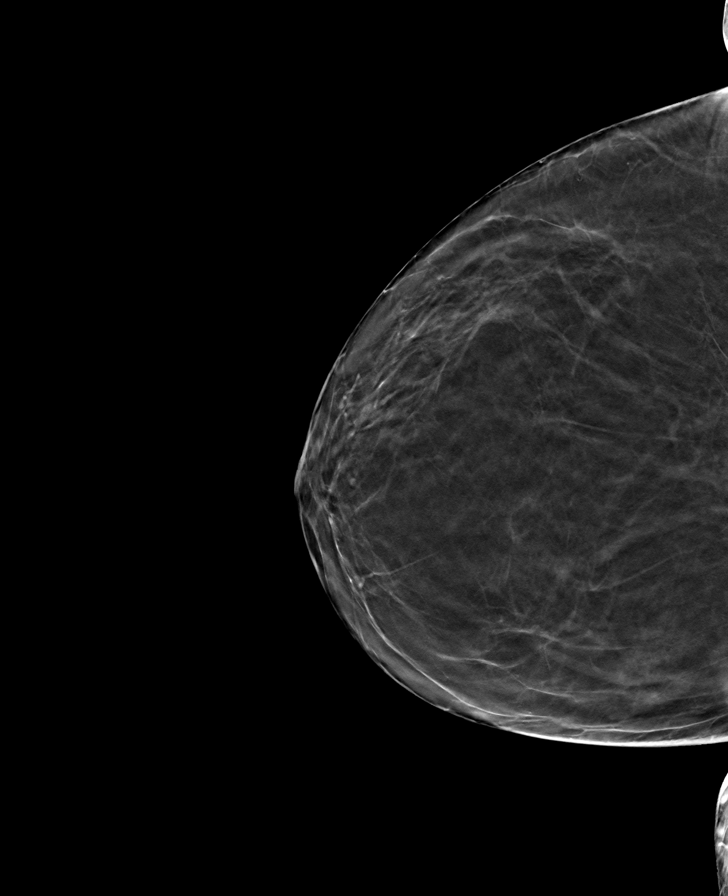

[8 of 24 positions shown; findings below may reference images not displayed]

ACR Breast Density Category b: There are scattered areas of
fibroglandular density.
FINDINGS: There are no findings suspicious for malignancy.
IMPRESSION: No mammographic evidence of malignancy. A result letter of this
screening mammogram will be mailed directly to the patient.

RECOMMENDATION:
Screening mammogram in one year. (Code:51-O-LD2)

BI-RADS CATEGORY  1: Negative.

## 2023-11-18 ENCOUNTER — Encounter (INDEPENDENT_AMBULATORY_CARE_PROVIDER_SITE_OTHER): Payer: Self-pay | Admitting: *Deleted

## 2023-11-18 NOTE — Progress Notes (Signed)
 7 yr TCS noted in recall Patient result letter mailed procedure note and pathology result faxed to PCP

## 2023-11-28 DIAGNOSIS — Z79899 Other long term (current) drug therapy: Secondary | ICD-10-CM | POA: Diagnosis not present

## 2023-11-28 DIAGNOSIS — E663 Overweight: Secondary | ICD-10-CM | POA: Diagnosis not present

## 2023-11-28 DIAGNOSIS — M1991 Primary osteoarthritis, unspecified site: Secondary | ICD-10-CM | POA: Diagnosis not present

## 2023-11-28 DIAGNOSIS — Z6828 Body mass index (BMI) 28.0-28.9, adult: Secondary | ICD-10-CM | POA: Diagnosis not present

## 2023-11-28 DIAGNOSIS — M0579 Rheumatoid arthritis with rheumatoid factor of multiple sites without organ or systems involvement: Secondary | ICD-10-CM | POA: Diagnosis not present

## 2023-11-29 DIAGNOSIS — I10 Essential (primary) hypertension: Secondary | ICD-10-CM | POA: Diagnosis not present

## 2023-11-29 DIAGNOSIS — E782 Mixed hyperlipidemia: Secondary | ICD-10-CM | POA: Diagnosis not present

## 2023-11-29 DIAGNOSIS — E039 Hypothyroidism, unspecified: Secondary | ICD-10-CM | POA: Diagnosis not present

## 2023-11-29 DIAGNOSIS — R5383 Other fatigue: Secondary | ICD-10-CM | POA: Diagnosis not present

## 2023-12-02 DIAGNOSIS — I10 Essential (primary) hypertension: Secondary | ICD-10-CM | POA: Diagnosis not present

## 2023-12-02 DIAGNOSIS — J302 Other seasonal allergic rhinitis: Secondary | ICD-10-CM | POA: Diagnosis not present

## 2023-12-02 DIAGNOSIS — Z79899 Other long term (current) drug therapy: Secondary | ICD-10-CM | POA: Diagnosis not present

## 2023-12-02 DIAGNOSIS — K5904 Chronic idiopathic constipation: Secondary | ICD-10-CM | POA: Diagnosis not present

## 2023-12-02 DIAGNOSIS — Z87891 Personal history of nicotine dependence: Secondary | ICD-10-CM | POA: Diagnosis not present

## 2023-12-02 DIAGNOSIS — G43909 Migraine, unspecified, not intractable, without status migrainosus: Secondary | ICD-10-CM | POA: Diagnosis not present

## 2023-12-02 DIAGNOSIS — M069 Rheumatoid arthritis, unspecified: Secondary | ICD-10-CM | POA: Diagnosis not present

## 2023-12-02 DIAGNOSIS — R5383 Other fatigue: Secondary | ICD-10-CM | POA: Diagnosis not present

## 2023-12-02 DIAGNOSIS — E039 Hypothyroidism, unspecified: Secondary | ICD-10-CM | POA: Diagnosis not present

## 2023-12-02 DIAGNOSIS — E782 Mixed hyperlipidemia: Secondary | ICD-10-CM | POA: Diagnosis not present

## 2023-12-02 DIAGNOSIS — Z79890 Hormone replacement therapy (postmenopausal): Secondary | ICD-10-CM | POA: Diagnosis not present

## 2023-12-05 DIAGNOSIS — H01001 Unspecified blepharitis right upper eyelid: Secondary | ICD-10-CM | POA: Diagnosis not present

## 2023-12-05 DIAGNOSIS — H40013 Open angle with borderline findings, low risk, bilateral: Secondary | ICD-10-CM | POA: Diagnosis not present

## 2023-12-05 DIAGNOSIS — M069 Rheumatoid arthritis, unspecified: Secondary | ICD-10-CM | POA: Diagnosis not present

## 2023-12-05 DIAGNOSIS — H01002 Unspecified blepharitis right lower eyelid: Secondary | ICD-10-CM | POA: Diagnosis not present

## 2023-12-25 ENCOUNTER — Encounter (INDEPENDENT_AMBULATORY_CARE_PROVIDER_SITE_OTHER): Payer: Self-pay | Admitting: Gastroenterology

## 2023-12-30 DIAGNOSIS — H26492 Other secondary cataract, left eye: Secondary | ICD-10-CM | POA: Diagnosis not present

## 2024-02-19 DIAGNOSIS — U071 COVID-19: Secondary | ICD-10-CM | POA: Diagnosis not present

## 2024-02-19 DIAGNOSIS — R051 Acute cough: Secondary | ICD-10-CM | POA: Diagnosis not present
